# Patient Record
Sex: Male | Born: 1955
Health system: Southern US, Community
[De-identification: ages and names within clinical notes are randomized; demographics above are authoritative.]

## PROBLEM LIST (undated history)

## (undated) DIAGNOSIS — E669 Obesity, unspecified: Secondary | ICD-10-CM

## (undated) DIAGNOSIS — I219 Acute myocardial infarction, unspecified: Secondary | ICD-10-CM

## (undated) DIAGNOSIS — G709 Myoneural disorder, unspecified: Secondary | ICD-10-CM

## (undated) DIAGNOSIS — E785 Hyperlipidemia, unspecified: Secondary | ICD-10-CM

## (undated) DIAGNOSIS — T7840XA Allergy, unspecified, initial encounter: Secondary | ICD-10-CM

## (undated) DIAGNOSIS — Z5189 Encounter for other specified aftercare: Secondary | ICD-10-CM

## (undated) DIAGNOSIS — I2510 Atherosclerotic heart disease of native coronary artery without angina pectoris: Secondary | ICD-10-CM

## (undated) DIAGNOSIS — I10 Essential (primary) hypertension: Secondary | ICD-10-CM

## (undated) DIAGNOSIS — E119 Type 2 diabetes mellitus without complications: Secondary | ICD-10-CM

## (undated) DIAGNOSIS — G4730 Sleep apnea, unspecified: Secondary | ICD-10-CM

## (undated) DIAGNOSIS — K219 Gastro-esophageal reflux disease without esophagitis: Secondary | ICD-10-CM

## (undated) DIAGNOSIS — K759 Inflammatory liver disease, unspecified: Secondary | ICD-10-CM

## (undated) DIAGNOSIS — M1990 Unspecified osteoarthritis, unspecified site: Secondary | ICD-10-CM

## (undated) HISTORY — DX: Sleep apnea, unspecified: G47.30

## (undated) HISTORY — DX: Atherosclerotic heart disease of native coronary artery without angina pectoris: I25.10

## (undated) HISTORY — DX: Acute myocardial infarction, unspecified: I21.9

## (undated) HISTORY — PX: CORONARY ANGIOPLASTY WITH STENT PLACEMENT: SHX49

## (undated) HISTORY — DX: Allergy, unspecified, initial encounter: T78.40XA

## (undated) HISTORY — DX: Obesity, unspecified: E66.9

## (undated) HISTORY — DX: Unspecified osteoarthritis, unspecified site: M19.90

## (undated) HISTORY — PX: OTHER SURGICAL HISTORY: SHX169

## (undated) HISTORY — PX: VASECTOMY: SHX75

## (undated) HISTORY — DX: Hyperlipidemia, unspecified: E78.5

## (undated) HISTORY — PX: SIGMOIDOSCOPY: SUR1295

## (undated) HISTORY — DX: Essential (primary) hypertension: I10

---

## 1982-11-11 HISTORY — PX: ELBOW SURGERY: SHX618

## 2003-01-24 ENCOUNTER — Encounter: Admission: RE | Admit: 2003-01-24 | Discharge: 2003-04-24 | Payer: Self-pay | Admitting: Internal Medicine

## 2006-11-11 DIAGNOSIS — I219 Acute myocardial infarction, unspecified: Secondary | ICD-10-CM

## 2006-11-11 HISTORY — DX: Acute myocardial infarction, unspecified: I21.9

## 2007-02-13 ENCOUNTER — Inpatient Hospital Stay (HOSPITAL_COMMUNITY): Admission: EM | Admit: 2007-02-13 | Discharge: 2007-02-16 | Payer: Self-pay | Admitting: Emergency Medicine

## 2007-02-13 ENCOUNTER — Emergency Department (HOSPITAL_COMMUNITY): Admission: EM | Admit: 2007-02-13 | Discharge: 2007-02-13 | Payer: Self-pay | Admitting: Emergency Medicine

## 2007-02-26 ENCOUNTER — Encounter (HOSPITAL_COMMUNITY): Admission: RE | Admit: 2007-02-26 | Discharge: 2007-05-27 | Payer: Self-pay | Admitting: Cardiology

## 2007-04-30 ENCOUNTER — Ambulatory Visit: Payer: Self-pay | Admitting: Internal Medicine

## 2007-05-04 ENCOUNTER — Ambulatory Visit (HOSPITAL_BASED_OUTPATIENT_CLINIC_OR_DEPARTMENT_OTHER): Admission: RE | Admit: 2007-05-04 | Discharge: 2007-05-04 | Payer: Self-pay | Admitting: Internal Medicine

## 2007-05-10 ENCOUNTER — Ambulatory Visit: Payer: Self-pay | Admitting: Internal Medicine

## 2007-05-20 ENCOUNTER — Ambulatory Visit: Payer: Self-pay | Admitting: Internal Medicine

## 2007-05-28 ENCOUNTER — Encounter (HOSPITAL_COMMUNITY): Admission: RE | Admit: 2007-05-28 | Discharge: 2007-08-11 | Payer: Self-pay | Admitting: Cardiology

## 2008-11-11 HISTORY — PX: JOINT REPLACEMENT: SHX530

## 2008-12-29 ENCOUNTER — Encounter: Payer: Self-pay | Admitting: Cardiology

## 2009-09-04 ENCOUNTER — Inpatient Hospital Stay (HOSPITAL_COMMUNITY): Admission: RE | Admit: 2009-09-04 | Discharge: 2009-09-06 | Payer: Self-pay | Admitting: Orthopedic Surgery

## 2010-05-03 ENCOUNTER — Encounter: Payer: Self-pay | Admitting: Internal Medicine

## 2010-07-24 ENCOUNTER — Ambulatory Visit: Payer: Self-pay | Admitting: Cardiology

## 2010-07-27 ENCOUNTER — Ambulatory Visit: Payer: Self-pay | Admitting: Cardiology

## 2010-12-14 NOTE — Letter (Signed)
Summary: CMN for CPAP Supplies/[facility]  CMN for CPAP Supplies/[facility]   Imported By: Sherian Rein 05/16/2010 13:38:50  _____________________________________________________________________  External Attachment:    Type:   Image     Comment:   External Document

## 2011-01-11 ENCOUNTER — Telehealth (INDEPENDENT_AMBULATORY_CARE_PROVIDER_SITE_OTHER): Payer: Self-pay | Admitting: *Deleted

## 2011-01-22 NOTE — Progress Notes (Signed)
Summary: req to have father seen by dr. Maple Hudson   Phone Note Call from Patient   Caller: Patient Call For: young Summary of Call: although pt last saw dr young in 08 (says he's doing fine) he requests that dr young please see pt's dad asap for pna f/u. pt lives at [facility] and needs to be seen asap. also son feels pt has sleep apnea. pt thinks very highly of dr young and would "greatly appreciate him seeing his dad. father is Peter Gray DOB: [birth year] mrn: [id number]. call son at cell [phone] Initial call taken by: Tivis Ringer, CNA,  January 11, 2011 3:13 PM  Follow-up for Phone Call        Please call patient and let him know at this time we are going through a system change and have no way to work his dad in; he can make an appt for first open slot with CDY and see his PCP in the meantime for PNA f/u. Also, the pt's son can call and check for cancelled appts that he might get in sooner.Peter Gray CMA  January 15, 2011 5:00 PM   [facility] Carver Fila  January 15, 2011 5:13 PM   Additional Follow-up for Phone Call Additional follow up Details #1::        Spoke with pt and advised of the above.  He verbalized understanding.  States that his Father does not necc have to be seen by Dr Maple Hudson.  He would just like for him to get est here with a pulm doc.  I advised then in that case I will look for the first available consult slot with any MD and put him there.  He states that he will have to call back to sched this as he does not have his sched in front of him.  I advised that this is fine, he can set this up with any of our schedulers.  Additional Follow-up by: Vernie Murders,  January 16, 2011 10:35 AM

## 2011-02-14 LAB — BASIC METABOLIC PANEL
Calcium: 8.2 mg/dL — ABNORMAL LOW (ref 8.4–10.5)
Chloride: 103 mEq/L (ref 96–112)
Chloride: 104 mEq/L (ref 96–112)
GFR calc Af Amer: >60 mL/min (ref >60)
Glucose, Bld: 176 mg/dL — ABNORMAL HIGH (ref 70–99)
Potassium: 5.5 mEq/L — ABNORMAL HIGH (ref 3.5–5.1)

## 2011-02-14 LAB — CBC
HCT: 42.3 % (ref 39.0–52.0)
MCHC: 34.8 g/dL (ref 30.0–36.0)
MCHC: 35.2 g/dL (ref 30.0–36.0)
MCV: 92.4 fL (ref 78.0–100.0)
MCV: 93.4 fL (ref 78.0–100.0)
Platelets: 157 10*3/uL (ref 150–400)
RBC: 3.6 MIL/uL — ABNORMAL LOW (ref 4.22–5.81)
RBC: 4.57 MIL/uL (ref 4.22–5.81)

## 2011-02-14 LAB — DIFFERENTIAL
Basophils Absolute: 0 10*3/uL (ref 0.0–0.1)
Eosinophils Relative: 1 % (ref 0–5)
Lymphocytes Relative: 29 % (ref 12–46)
Monocytes Relative: 6 % (ref 3–12)
Neutrophils Relative %: 64 % (ref 43–77)

## 2011-02-14 LAB — URINALYSIS, ROUTINE W REFLEX MICROSCOPIC
Bilirubin Urine: NEGATIVE
Hgb urine dipstick: NEGATIVE
Ketones, ur: NEGATIVE mg/dL
Nitrite: NEGATIVE
Protein, ur: NEGATIVE mg/dL
Specific Gravity, Urine: 1.029 (ref 1.005–1.030)
Urobilinogen, UA: 0.2 mg/dL (ref 0.0–1.0)
pH: 6 (ref 5.0–8.0)

## 2011-02-14 LAB — PROTIME-INR
INR: 1.03 (ref 0.00–1.49)
INR: 1.11 (ref 0.00–1.49)

## 2011-02-14 LAB — TYPE AND SCREEN
ABO/RH(D): O POS
Antibody Screen: NEGATIVE

## 2011-03-26 NOTE — Procedures (Signed)
NAME:  Peter Gray, Peter Gray                 ACCOUNT NO.:  [id number]   MEDICAL RECORD NO.:  [id number]          PATIENT TYPE:  OUT   LOCATION:  SLEEP CENTER                 FACILITY:  [facility]   PHYSICIAN:  Clinton D. Maple Hudson, MD, FCCP, FACPDATE OF BIRTH:  10-30-1957   DATE OF STUDY:  05/04/2007                            NOCTURNAL POLYSOMNOGRAM   REFERRING PHYSICIAN:  Clinton D. Young, MD, FCCP, FACP   INDICATIONS FOR PROCEDURE:  Hypersomnia with sleep apnea.   RESULTS:  Epward sleepiness score 16/24, BMI 29, weight 235 pounds.   MEDICATIONS:  Home medications are listed and reviewed.   SLEEP ARCHITECTURE:  Total sleep time 379 minutes with sleep efficiency  93%. Stage 1 was 4%; Stage 2, 80%; Stages 3 and 4, absent. REM 17% of  total sleep time. Sleep latency 5 minuets, REM latency 75 minutes. Awake  after sleep onset, 25 minutes. Arousal index, 8.1. No bedtime medication  was taken.   RESPIRATORY DATA:  Split study protocol. Apnea/hypopnea index (AHI RDI)  14.6 obstructive events per hour, indicating mild to moderate  obstructive sleep apnea/hypopnea syndrome before CPAP. There were 8  obstructive apnea's, 1 central apnea, and 31 hypopnea's before CPAP.  Events were not positional. REM AHI 3.7. CPAP was titrated to 10 CWP,  AHI zero per hour. An Opti-Life II with large nasal pillows mask was  chosen with a heated humidifier.   OXYGEN DATA:  Mild to moderate snoring with oxygen desaturation to a  nadir of 83% before CPAP. After CPAP control, saturation held 94% on  room air.   CARDIAC DATA:  Sinus rhythm with occasional PVC.   MOVEMENT/PARASOMNIA:  Occasional limb jerk with arousal, not  significant. Bathroom x1.   IMPRESSION/RECOMMENDATIONS:  1. Mild to moderate obstructive sleep apnea/hypopnea syndrome, AHI      14.6 per hour. Non-positional events with mild to moderate snoring      and oxygen desaturation to a nadir of 81%.  2. Successful CPAP titration to 10 CWP, AHI zero per hour.  An Opti-      Life II was chosen with large nasal pillows and heated humidifier.      Clinton D. Maple Hudson, MD, [facility], FACP  Diplomate, Biomedical engineer of Sleep Medicine  Electronically Signed     CDY/MEDQ  D:  05/10/2007 11:13:22  T:  05/10/2007 20:19:40  Job:  [id number]

## 2011-03-26 NOTE — Assessment & Plan Note (Signed)
[facility]                             PULMONARY OFFICE NOTE   NAME:Myre, UNO ESAU                        MRN:          [id number]  DATE:04/30/2007                            DOB:          Dec 21, 1955    PROBLEM:  A 55 year old man diagnosed in the past with obstructive sleep  apnea wanting reevaluation.  He rarely uses CPAP unless he shares room  with his wife.  Machine was too loud and by description that was many  years ago.  I do not have those record currently.  He is aware that he  snores loudly and that CPAP did help.  There is some daytime sleepiness.  He has had a myocardial infarction and is trying to pull things  together, I think.   MEDICATIONS:  Altace, metoprolol, Plavix, Lipitor, fish oil, aspirin 325  mg, Naproxen, nitroglycerin.  No medication allergy.   REVIEW OF SYSTEMS:  Chest pain at time of myocardial infarction, February 13, 2007.  He dieted off 40 pounds to improve his lipids, regained 25  pounds and has dieted off 25 again.  Loud snoring.  No significant nasal  congestion.  Some joint stiffness.   PAST MEDICAL HISTORY:  1. Obstructive sleep apnea.  2. Resection of a nasal polyp.  3. Myocardial infarction in April 2008 with stent.  4. Elevated cholesterol.  5. No history of lung disease, thyroid disease, or ENT surgery.  6. He has had some arthritis and surgery for a bone spur on his elbow.   SOCIAL HISTORY:  Rare social alcohol.  He is married with children,  works as an Pensions consultant.   FAMILY HISTORY:  Maternal grandmother smoked, had emphysema.  Mother  died in her 27s of heart disease and had breast cancer.  Father snores.  Nobody known to have sleep apnea.   OBJECTIVE:  VITAL SIGNS:  Weight 239 pounds, BP 110/64, pulse 69, room  air saturation 99%.  GENERAL:  He is an alert, calm gentleman.  NEUROLOGIC:  Unremarkable to observation.  HEENT:  Palate spacing 3/4.  Voice quality is normal without stridor.  Nasal airway is  unobstructed.  CHEST:  Quiet, clear breath sounds, unlabored.  HEART:  Sounds regular without murmur or gallop.  No restlessness or  tremor.   IMPRESSION:  1. Obstructive sleep apnea needing to reestablish diagnosis and assess      best therapy.  We will plan a split protocol sleep study and bring      him back after that is completed for followup.  2. His additional problems are coronary disease with myocardial      infarction and hyperlipidemia.     Clinton D. Maple Hudson, MD, [facility], FACP  Electronically Signed    CDY/MedQ  DD: 04/30/2007  DT: 05/01/2007  Job #: [id number]   cc:   Geoffry Paradise, M.D.  Peter M. Swaziland, M.D.  [facility]

## 2011-03-26 NOTE — Assessment & Plan Note (Signed)
[facility]                             PULMONARY OFFICE NOTE   NAME:Peter Gray, Peter Gray                        MRN:          [id number]  DATE:05/20/2007                            DOB:          12-06-1955    PULMONARY FOLLOWUP   PROBLEM LIST:  1. Obstructive sleep apnea.  2. Coronary disease/myocardial infarction.  3. Hyperlipidemia.   HISTORY:  He returns after a sleep study done May 04, 2007  demonstrating a mild to moderate obstructive apnea with an index of 14.6  per hour, mild to moderate snoring, and desaturation to 81%.  CPAP  titrated to 10 CWP for an index of 0 per hour.  We reviewed his previous  history and, again, reviewed the physiology and medical options for  sleep apnea.   OBJECTIVE:  Weight 237 pounds, BP 118/68, pulse 72, room air saturation  97%.  He is calm and alert.  Nasal airway is not obstructed.  Breathing is unlabored.  Pulse regular.  Voice quality normal without stridor.   IMPRESSION:  Obstructive sleep apnea (AHI 14.6 per hour).  Note that his  arthritis problems effect his sleep comfort as well.   PLAN:  1. Home CPAP trial at 10 CWP.  2. Short-term availability temazepam 15 mg #30 one or 2 at bedtime      p.r.n. sleep with discussion.  3. Sleep hygiene.  4. Schedule return in 1 month, earlier p.r.n.     Clinton D. Maple Hudson, MD, [facility]oll[facility], FACP  Electronically Signed    CDY/MedQ  DD: 06/04/2007  DT: 06/05/2007  Job #: [id number]   cc:   Geoffry Paradise, M.D.  Peter M. Swaziland, M.D.

## 2011-03-29 NOTE — Cardiovascular Report (Signed)
Peter Gray, Peter Gray NO.:  [id number]   MEDICAL RECORD NO.:  [id number]          PATIENT TYPE:  INP   LOCATION:  2917                         FACILITY:  [facility]   PHYSICIAN:  Peter M. Swaziland, M.D.  DATE OF BIRTH:  [birth year]   DATE OF PROCEDURE:  02/13/2007  DATE OF DISCHARGE:                            CARDIAC CATHETERIZATION   INDICATIONS FOR PROCEDURE:  The patient is a 55 year old white male with  history of hyperlipidemia who presents with refractory chest pain.  ECG  is nondiagnostic, but cardiac enzymes were mildly elevated.  The patient  has refractory chest pain despite optimal medical therapy.   PROCEDURES:  1. Left heart catheterization.  2. Coronary and left ventricular angiography.  3. Intracoronary stenting of the left circumflex coronary artery.   ACCESS:  Via right femoral artery using standard Seldinger technique.   EQUIPMENT:  6-French 4 cm right and left Judkins catheters, 6-French  pigtail catheter, 6-French arterial sheath, 6-French FL-4 guide,  0.014  high-torque floppy wire, a 3.0 x 15 mm Maverick balloon, a 3.5 x 16 mm  Liberte stent, and a 3.5 x 12-mm Quantum Maverick balloon.   MEDICATIONS:  1. Local anesthesia with 1% Xylocaine.  2. Versed 4 mg IV.  3. Fentanyl 25 mcg IV.  4. Heparin 2000 units IV with subsequent ACT of 242.  5. Integrilin double bolus followed by continuous IV infusion.  6. Plavix 600 mg p.o.   CONTRAST:  150 mL of Omnipaque.   HEMODYNAMIC DATA:  Aortic pressure was 148/91 with a mean of 114. Left  ventricle pressure was 149 with EDP of 30 mmHg.   ANGIOGRAPHIC DATA:  The left coronary artery arises and distributes  normally.  The left main coronary artery appears normal.   The left anterior descending artery has scattered mild narrowings in the  proximal mid vessel up to 20%.   The left circumflex coronary is a large vessel which is occluded  proximally.  There are right-to-left collaterals to the  circumflex from  the right coronary.   The right coronary is a large, dominant vessel.  It has diffuse disease  with mild narrowings in the proximal, mid, and distal vessel up to 20%.   The left ventricular angiography was performed in the procedure.  This  was performed in both the RAO and LAO cranial views.  This demonstrates  mild left ventricular enlargement.  There is hypokinesia of the inferior  and lateral wall with overall mild left ventricular dysfunction.  Ejection fraction is estimated at 45%.  There is no mitral regurgitation  or prolapse.   We proceeded with acute intervention of the left circumflex coronary  artery.  We were able to cross the lesion without difficulty with the  wire.  This demonstrated a large terminal circumflex with two large  marginal vessels.  The lesion appeared to terminate before the takeoff  of the first marginal vessel.  This lesion was predilated using a 3.0 x  15 mm Maverick balloon up to 6 atmospheres.  We then deployed the 3.5 x  16 mm Liberte stent across the  lesion, dilating to 9 atmospheres and  then 12 atmospheres with the stent balloon.  We then postdilated the  entire segment of the stent with a 3.5 x 12 mm Quantum Maverick balloon,  dilating up to 14 atmospheres x2.  This yielded an excellent  angiographic result with 0% residual stenosis and TIMI grade 3 flow.  The patient remained hemodynamically stable throughout.   FINAL INTERPRETATION:  1. Single-vessel occlusive atherosclerotic coronary artery disease.  2. Mild left ventricular dysfunction.  3. Successful intracoronary stenting of the left circumflex coronary.           ______________________________  Peter M. Swaziland, M.D.     PMJ/MEDQ  D:  02/13/2007  T:  02/14/2007  Job:  [id number]

## 2011-03-29 NOTE — Discharge Summary (Signed)
Peter Gray, Peter Gray NO.:  [id number]   MEDICAL RECORD NO.:  [id number]          PATIENT TYPE:  INP   LOCATION:  3732                         FACILITY:  [facility]   PHYSICIAN:  Peter M. Swaziland, M.D.  DATE OF BIRTH:  04-19-1956   DATE OF ADMISSION:  02/13/2007  DATE OF DISCHARGE:  02/16/2007                               DISCHARGE SUMMARY   HISTORY OF PRESENT ILLNESS:  The patient is a 55 year old white male who  presented with refractory chest pain.  He had developed chest pain the  night prior to admission.  He was seen in Urgent Care and an ECG, at  that time, was normal.  He subsequently presented back to the emergency  department with recurrent chest pain.  Once, again, ECG and cardiac  enzymes were negative.  His pain seemed to abate; and he was discharged  to home.  He subsequently had recurrent severe chest pain in the mid  morning and presented to Dr. Lanell Matar office and was referred back to  the emergency department.  Again, ECG was normal; but at this time his  troponin was mildly elevated.  The patient had had ongoing chest pain at  time of presentation.  He had a CT angiogram of the chest which was  negative.  Abdominal ultrasound was also negative.  The patient does  have a history of hyperlipidemia and obesity.  For details of his past  medical history, social history, family history, and physical exam  please see admission history and physical.   LABORATORY DATA:  White count was 12,700; hemoglobin 13.7; hematocrit  39.7; platelets 221,000.  Sodium 135, potassium 3.9, chloride 101, CO2  26, glucose 192, BUN 12, creatinine 1.10, calcium was 8.7.  Hemoglobin  A1c was 6.4%.  Initial CK was 77 with 5.2 MB, troponin was 0.09.  Cholesterol was 239, triglycerides 554, HDL 23, LDL could not be  calculated due to his high triglycerides.  ECG showed normal sinus  rhythm there was very slight ST depression in lead aVL and V-2.  Chest x-  ray was   unremarkable.   HOSPITAL COURSE:  The patient was treated in the emergency department  with aspirin, IV heparin, and nitroglycerin.  We proceeded with urgent  cardiac catheterization.  This demonstrated nonobstructive disease in  the LAD and right coronary distributions.  The left circumflex coronary  artery was a large vessel, and occluded proximally.  There was some  collateral flow from the right coronary.  Left ventricular angiography  showed inferior and inferolateral hypokinesia with overall mild left  ventricular dysfunction, and ejection fraction approximately 45%.   The patient underwent emergent stenting of the left circumflex coronary  artery.  He had a 3.5 x 16 mm Liberte stent  placed.  This revealed an  excellent angiographic result with TIMI grade 3 flow down two large  marginal vessels.  His chest pain abated at this time.  He was  subsequently treated with aspirin and Plavix.  He was treated with  Integrilin for 18 hours; and he was started on beta blocker and statin  therapy.  Once we received his lipid results he was also started on  Lovaza as his blood pressure stabilized; he was started on an ACE  inhibitor as well.   The patient was progressively ambulated.  He had no further chest pain  or shortness of breath.  He had no arrhythmias.  His groin showed some  moderate bruising, but no significant hematoma and pulses remained 2+.  His cardiac enzymes:  CPK increased to 970 with 227.8 MB and then 1191  with 264.4 MB.  Troponins increased to 17.61 and then 33.81.  His ECG  remained normal.  The patient was progressively ambulated with cardiac  rehab.  It was felt that he was stable for discharge on February 16, 2007.   DISCHARGE DIAGNOSIS:  1. Acute lateral myocardial infarction.  2. Status post successful stenting of the proximal left circumflex      coronary.  3. Combined hyperlipidemia.  4. Obesity.   DISCHARGE MEDICATIONS:  1. Coated aspirin 325 mg per day.  2.  Plavix 75 mg per day.  3. Lipitor 80 mg per day.  4. Toprol XL 100 mg per day.  5. Altace 2.5 mg daily.  6. Lovaza 4 grams daily.  7. Nitroglycerin 0.4 mg sublingual p.r.n.   Patient is to gradually increase his activity.  I have recommended  avoiding lifting for 1 week.  He will see Dr. Swaziland back in  approximately 1 week for followup; and I have encouraged him to enroll  in cardiac rehab.           ______________________________  Peter M. Swaziland, M.D.     PMJ/MEDQ  D:  02/16/2007  T:  02/16/2007  Job:  [id number]   cc:   Geoffry Paradise, M.D.

## 2011-03-29 NOTE — H&P (Signed)
Peter Gray, LANDESS NO.:  [id number]   MEDICAL RECORD NO.:  [id number]          PATIENT TYPE:  INP   LOCATION:  2917                         FACILITY:  [facility]   PHYSICIAN:  Peter M. Swaziland, M.D.  DATE OF BIRTH:  Apr 16, 1956   DATE OF ADMISSION:  02/13/2007  DATE OF DISCHARGE:                              HISTORY & PHYSICAL   HISTORY OF PRESENT ILLNESS:  The patient is a 55 year old white male who  presents with refractory chest pain.  He describes his pain as a severe  burning sensation in his mid sternum radiating out to the parasternal  regions.  He has no associated shortness of breath, nausea, vomiting or  diaphoresis.  Pain began last evening after having eaten a heavy Chinese  meal.  It became more severe around 8:30.  He went to Urgent Care. There  chest x-ray and ECG were unremarkable.  The patient was sent home.  He  awoke later in the night with more severe pain and came to the emergency  room. At that time, ECG again was normal, and point-of-care cardiac  enzymes were negative x2.  He was given a GI cocktail that he does not  really think offered much relief, but his pain did seem to ebb.  He was  discharged about 6:30 a.m. Later in the morning, his pain then recurred,  again more severe. He went to Dr. Lanell Matar office who recommended he  come to the emergency department.   At this time, he had a CT angiogram of the chest which was negative.  Abdominal ultrasound was normal.  He had another set of cardiac enzymes  that showed an elevated troponin of 0.09, and CPK was 77 with 5.2 MB. He  has been treated with IV heparin, nitroglycerin, aspirin and morphine  but is still having refractory chest pain.  Because of these findings,  he is admitted for further evaluation.   PAST MEDICAL HISTORY:  Is significant for:  1. Mild hypercholesterolemia.  2. He has a history of obesity associated with glucose intolerance.  3. He has a history of severe  arthritis.   ALLERGIES:  BEE STINGS.   PRIOR MEDICATIONS:  Include p.r.n. Indocin CR and Naprosyn.   PRIOR SURGERY:  Includes removal of bone spur in his right elbow.   SOCIAL HISTORY:  He is a Clinical research associate.  He has two daughters.  He is married.  He denies tobacco use.   FAMILY HISTORY:  Mother died of coronary disease at age 15. She was  chronically ill with ankylosing spondylitis, arthritis, and breast  cancer.  His father is still alive at age 21, and he has one sibling  alive and well.   REVIEW OF SYSTEMS:  His review of systems is otherwise unremarkable.   PHYSICAL EXAMINATION:  GENERAL:  He is a well-developed white male in no  distress.  VITAL SIGNS:  Blood pressure 123/73. Pulse is 72 and regular.  He is  afebrile.  Saturation is 99% on room air.  HEENT:  Exam is unremarkable.  NECK:  No JVD or bruits.  LUNGS:  Clear.  CARDIAC:  Exam reveals regular rate and rhythm without murmur, rub or  gallop.  He has no chest wall tenderness to palpation.  ABDOMEN:  Soft, nontender, without masses or bruits.  EXTREMITIES:  Pulses were 2+ and symmetric.  He has no edema.  NEUROLOGIC:  Exam is nonfocal.   LABORATORY DATA:  The Ph was 7.42, pCO2 of 53, bicarb 34. Hemoglobin  15.6, hematocrit 46. Sodium 139, potassium 3.9, chloride 104, BUN 21,  creatinine 1.1, glucose of 118. D-dimer is less than 0.22.   Cardiac enzymes, CT of the chest, and abdominal ultrasound are already  mentioned.   ECG shows normal sinus rhythm with slight ST depression in leads aVL and  V2.   IMPRESSION:  1. Acute coronary syndrome, probable non-Q-wave myocardial infarction      with ongoing chest pain despite aggressive medical therapy.  2. Hypercholesterolemia.  3. History of obesity and glucose intolerance.  4. Arthritis.   PLAN:  Recommend emergent cardiac catheterization to further define the  etiology of his chest pain offer therapeutic options.  He will continue  on aspirin, IV nitroglycerin and  heparin.  Continue analgesia with  morphine.           ______________________________  Peter M. Swaziland, M.D.     PMJ/MEDQ  D:  02/13/2007  T:  02/14/2007  Job:  [id number]   cc:   Geoffry Paradise, M.D.  Areatha Keas, M.D.

## 2011-04-02 ENCOUNTER — Other Ambulatory Visit: Payer: Self-pay | Admitting: Cardiology

## 2011-04-02 NOTE — Telephone Encounter (Signed)
escribe medication per fax request  

## 2011-04-09 ENCOUNTER — Encounter: Payer: Self-pay | Admitting: Cardiology

## 2011-06-07 ENCOUNTER — Other Ambulatory Visit: Payer: Self-pay | Admitting: Cardiology

## 2011-06-07 NOTE — Telephone Encounter (Signed)
escribe medication per fax request  

## 2011-07-01 ENCOUNTER — Other Ambulatory Visit: Payer: Self-pay | Admitting: Cardiology

## 2011-07-01 NOTE — Telephone Encounter (Signed)
escribe medication per fax request  

## 2011-08-14 ENCOUNTER — Other Ambulatory Visit: Payer: Self-pay | Admitting: *Deleted

## 2011-08-14 NOTE — Telephone Encounter (Signed)
Opened in Error.

## 2011-09-09 ENCOUNTER — Telehealth: Payer: Self-pay | Admitting: Cardiology

## 2011-09-09 NOTE — Telephone Encounter (Signed)
Pt needs refill of nitro, refills have expired, [facility] [employer]

## 2011-09-10 ENCOUNTER — Other Ambulatory Visit: Payer: Self-pay

## 2011-09-11 MED ORDER — NITROGLYCERIN 0.4 MG SL SUBL: 0.4000 mg | SUBLINGUAL_TABLET | SUBLINGUAL | Status: DC | PRN

## 2011-09-24 ENCOUNTER — Other Ambulatory Visit: Payer: Self-pay | Admitting: Cardiology

## 2011-12-02 ENCOUNTER — Other Ambulatory Visit: Payer: Self-pay | Admitting: Cardiology

## 2011-12-05 ENCOUNTER — Telehealth: Payer: Self-pay | Admitting: Cardiology

## 2011-12-05 NOTE — Telephone Encounter (Signed)
New Problem:    Patient called in needing a refill of all of his medications on his list, especially his LOVAZA 1 G capsule.

## 2011-12-06 ENCOUNTER — Other Ambulatory Visit: Payer: Self-pay

## 2011-12-06 MED ORDER — OMEGA-3-ACID ETHYL ESTERS 1 G PO CAPS: 1.0000 g | ORAL_CAPSULE | Freq: Four times a day (QID) | ORAL | Status: DC

## 2011-12-06 NOTE — Telephone Encounter (Signed)
Pt. Has appt in February so sent refill in.

## 2011-12-22 ENCOUNTER — Other Ambulatory Visit: Payer: Self-pay | Admitting: Cardiology

## 2011-12-24 ENCOUNTER — Ambulatory Visit (INDEPENDENT_AMBULATORY_CARE_PROVIDER_SITE_OTHER): Payer: BC Managed Care – PPO | Admitting: Cardiology

## 2011-12-24 ENCOUNTER — Encounter: Payer: Self-pay | Admitting: Cardiology

## 2011-12-24 VITALS — BP 118/70 | HR 67 | Ht 75.0 in | Wt 259.0 lb

## 2011-12-24 DIAGNOSIS — I10 Essential (primary) hypertension: Secondary | ICD-10-CM | POA: Insufficient documentation

## 2011-12-24 DIAGNOSIS — E785 Hyperlipidemia, unspecified: Secondary | ICD-10-CM

## 2011-12-24 DIAGNOSIS — E669 Obesity, unspecified: Secondary | ICD-10-CM

## 2011-12-24 DIAGNOSIS — I2510 Atherosclerotic heart disease of native coronary artery without angina pectoris: Secondary | ICD-10-CM

## 2011-12-24 MED ORDER — RAMIPRIL 2.5 MG PO CAPS: 2.5000 mg | ORAL_CAPSULE | Freq: Every day | ORAL | Status: DC

## 2011-12-24 MED ORDER — OMEGA-3-ACID ETHYL ESTERS 1 G PO CAPS: 1.0000 g | ORAL_CAPSULE | Freq: Four times a day (QID) | ORAL | Status: DC

## 2011-12-24 MED ORDER — METOPROLOL SUCCINATE ER 100 MG PO TB24: 100.0000 mg | ORAL_TABLET | Freq: Every day | ORAL | Status: DC

## 2011-12-24 MED ORDER — ATORVASTATIN CALCIUM 80 MG PO TABS: 80.0000 mg | ORAL_TABLET | Freq: Every day | ORAL | Status: DC

## 2011-12-24 NOTE — Patient Instructions (Signed)
We will schedule you for fasting lab work.  I will see you again in 1 year.

## 2011-12-24 NOTE — Assessment & Plan Note (Signed)
Blood pressure is well controlled on his current medications. We will continue his current regimen.

## 2011-12-24 NOTE — Progress Notes (Signed)
   Peter Gray Date of Birth: 1956/10/12 Medical Record #[id number]  History of Present Illness: Peter Gray is seen for followup today. He was last seen in September of 2011. He has a history of coronary disease and had a lateral myocardial infarction in April 2008. He had emergent stenting of the left circumflex coronary at that time with a bare-metal stent. He had a normal nuclear stress test in February of 2010. He admits that he has not been doing well with his diet. He eats out frequently. He has gained weight. He hasn't been exercising regularly. He denies any complaints. He specifically denies any chest pain, shortness of breath, or palpitations.  Current Outpatient Prescriptions on File Prior to Visit  Medication Sig Dispense Refill  . nitroGLYCERIN (NITROSTAT) 0.4 MG SL tablet Place 1 tablet (0.4 mg total) under the tongue every 5 (five) minutes as needed for chest pain.  25 tablet  3  . DISCONTD: atorvastatin (LIPITOR) 80 MG tablet TAKE ONE TABLET BY MOUTH DAILY  30 tablet  0  . DISCONTD: omega-3 acid ethyl esters (LOVAZA) 1 G capsule Take 1 capsule (1 g total) by mouth 4 (four) times daily.  120 capsule  0    No Known Allergies  Past Medical History  Diagnosis Date  . Hyperlipidemia   . Obesity   . Hypertension   . Coronary artery disease   . Sleep apnea   . Gout   . Myocardial infarction   . Arthritis     Past Surgical History  Procedure Date  . Coronary angioplasty with stent placement   . Elbow surgery     History  Smoking status  . Never Smoker   Smokeless tobacco  . Not on file    History  Alcohol Use     Family History  Problem Relation Age of Onset  . Heart disease Mother     CAD  . Cancer Mother     Breast Cancer    Review of Systems: The review of systems is positive for chronic arthralgias in his feet. He does take Indocin rarely for symptoms of gout.  All other systems were reviewed and are negative.  Physical Exam: BP 118/70  Pulse 67  Ht  6\' 3"  (1.905 m)  Wt 259 lb (117.482 kg)  BMI 32.37 kg/m2 He is an overweight white male in no acute distress.The patient is alert and oriented x 3.  The mood and affect are normal.  The skin is warm and dry.  Color is normal.  The HEENT exam reveals that the sclera are nonicteric.  The mucous membranes are moist.  The carotids are 2+ without bruits.  There is no thyromegaly.  There is no JVD.  The lungs are clear.  The chest wall is non tender.  The heart exam reveals a regular rate with a normal S1 and S2.  There are no murmurs, gallops, or rubs.  The PMI is not displaced.   Abdominal exam reveals good bowel sounds.  There is no guarding or rebound.  There is no hepatosplenomegaly or tenderness.  There are no masses.  Exam of the legs reveal no clubbing, cyanosis, or edema.  The legs are without rashes.  The distal pulses are intact.  Cranial nerves II - XII are intact.  Motor and sensory functions are intact.  The gait is normal.  LABORATORY DATA: ECG demonstrates normal sinus rhythm with a normal ECG.  Assessment / Plan:

## 2011-12-24 NOTE — Assessment & Plan Note (Signed)
He remains asymptomatic at this time. His ECG is normal. We will continue to focus on risk factor modification. He clearly needs to do better with his lifestyle modification including increased aerobic activity and eating a heart healthy diet. He needs to lose weight. We did refill his medications today we'll plan a followup again in one year. I would anticipate a repeat stress test next year.

## 2011-12-24 NOTE — Assessment & Plan Note (Signed)
He has not had followup evaluation since September of 2011. We will schedule him for fasting lab work. This will include complete chemistries, lipid panel, and A1c.

## 2011-12-25 ENCOUNTER — Ambulatory Visit (INDEPENDENT_AMBULATORY_CARE_PROVIDER_SITE_OTHER): Payer: BC Managed Care – PPO | Admitting: *Deleted

## 2011-12-25 DIAGNOSIS — I2510 Atherosclerotic heart disease of native coronary artery without angina pectoris: Secondary | ICD-10-CM

## 2011-12-25 DIAGNOSIS — I10 Essential (primary) hypertension: Secondary | ICD-10-CM

## 2011-12-25 DIAGNOSIS — E785 Hyperlipidemia, unspecified: Secondary | ICD-10-CM

## 2011-12-25 LAB — BASIC METABOLIC PANEL
BUN: 21 mg/dL (ref 6–23)
Chloride: 100 mEq/L (ref 96–112)
GFR: 81.59 mL/min (ref >60.00)
Glucose, Bld: 134 mg/dL — ABNORMAL HIGH (ref 70–99)
Potassium: 5.3 mEq/L — ABNORMAL HIGH (ref 3.5–5.1)

## 2011-12-25 LAB — LIPID PANEL
LDL Cholesterol: 102 mg/dL — ABNORMAL HIGH (ref 0–99)
Total CHOL/HDL Ratio: 4
VLDL: 27.6 mg/dL (ref 0.0–40.0)

## 2011-12-25 LAB — HEPATIC FUNCTION PANEL
Bilirubin, Direct: 0.1 mg/dL (ref 0.0–0.3)
Total Bilirubin: 1.1 mg/dL (ref 0.3–1.2)

## 2011-12-25 LAB — PSA: PSA: 0.6 ng/mL (ref 0.10–4.00)

## 2011-12-31 NOTE — Progress Notes (Signed)
Addended by: Judithe Modest D on: 12/31/2011 05:54 PM   Modules accepted: Orders

## 2012-01-01 ENCOUNTER — Telehealth: Payer: Self-pay | Admitting: Cardiology

## 2012-01-01 NOTE — Telephone Encounter (Signed)
Fu call °Patient returning your call about blood work °

## 2012-01-01 NOTE — Telephone Encounter (Signed)
Pt aware of lab results and recommendations.  Copy of results mailed to pt.  Mylo Red RN

## 2012-01-22 ENCOUNTER — Other Ambulatory Visit: Payer: Self-pay | Admitting: Cardiology

## 2012-02-20 ENCOUNTER — Encounter: Payer: Self-pay | Admitting: *Deleted

## 2012-08-21 ENCOUNTER — Other Ambulatory Visit: Payer: Self-pay | Admitting: *Deleted

## 2012-08-21 MED ORDER — OMEGA-3-ACID ETHYL ESTERS 1 G PO CAPS: 1.0000 g | ORAL_CAPSULE | Freq: Four times a day (QID) | ORAL | Status: DC

## 2012-08-21 NOTE — Telephone Encounter (Signed)
Fax Received. Refill Completed. Peter Gray (R.M.A)   

## 2012-12-03 ENCOUNTER — Other Ambulatory Visit: Payer: Self-pay

## 2012-12-03 MED ORDER — RAMIPRIL 2.5 MG PO CAPS: 2.5000 mg | ORAL_CAPSULE | Freq: Every day | ORAL | Status: DC

## 2012-12-17 ENCOUNTER — Other Ambulatory Visit: Payer: Self-pay

## 2012-12-17 MED ORDER — OMEGA-3-ACID ETHYL ESTERS 1 G PO CAPS: 1.0000 g | ORAL_CAPSULE | Freq: Four times a day (QID) | ORAL | Status: DC

## 2012-12-23 ENCOUNTER — Ambulatory Visit (INDEPENDENT_AMBULATORY_CARE_PROVIDER_SITE_OTHER): Payer: BC Managed Care – PPO | Admitting: Family Medicine

## 2012-12-23 VITALS — BP 122/76 | HR 70 | Temp 97.8°F | Resp 18 | Ht 74.0 in | Wt 264.0 lb

## 2012-12-23 DIAGNOSIS — J111 Influenza due to unidentified influenza virus with other respiratory manifestations: Secondary | ICD-10-CM

## 2012-12-23 DIAGNOSIS — R6889 Other general symptoms and signs: Secondary | ICD-10-CM

## 2012-12-23 DIAGNOSIS — J019 Acute sinusitis, unspecified: Secondary | ICD-10-CM

## 2012-12-23 LAB — POCT INFLUENZA A/B
Influenza A, POC: NEGATIVE
Influenza B, POC: NEGATIVE

## 2012-12-23 MED ORDER — AMOXICILLIN-POT CLAVULANATE 875-125 MG PO TABS: 1.0000 | ORAL_TABLET | Freq: Two times a day (BID) | ORAL | Status: DC

## 2012-12-23 NOTE — Progress Notes (Signed)
[facility]:  Office Visit  Chief Complaint:  Chief Complaint  Patient presents with  . Sinusitis    left side - 2 days  . Dizziness    HPI: Peter Gray. is a 57 y.o. male who complains of 2 day history of left sided facial pain behind eyes, feels a little dizzy. Irritation of throat. No ear pain. Tried sudafed without relief, yellow dc from nose, dry cough. Msk tiredness and joint pain. Got flu vaccine this year. Denies fevers, chills, CP, SOB, palpitations.   Past Medical History  Diagnosis Date  . Hyperlipidemia   . Obesity   . Hypertension   . Coronary artery disease   . Sleep apnea   . Gout   . Myocardial infarction   . Arthritis    Past Surgical History  Procedure Laterality Date  . Coronary angioplasty with stent placement    . Elbow surgery    . Joint replacement    . Vasectomy     History   Social History  . Marital Status: Married    Spouse Name: N/A    Number of Children: N/A  . Years of Education: N/A   Social History Main Topics  . Smoking status: Never Smoker   . Smokeless tobacco: None  . Alcohol Use: Yes  . Drug Use: No  . Sexually Active: Yes    Birth Control/ Protection: Surgical   Other Topics Concern  . None   Social History Narrative  . None   Family History  Problem Relation Age of Onset  . Coronary artery disease Mother   . Breast cancer Mother   . Other Father   . Coronary artery disease Father   . Heart failure Father    No Known Allergies Prior to Admission medications   Medication Sig Start Date End Date Taking? Authorizing Provider  aspirin 325 MG tablet Take 325 mg by mouth daily.   Yes Historical Provider, MD  atorvastatin (LIPITOR) 80 MG tablet Take 1 tablet (80 mg total) by mouth daily. 12/24/11  Yes Peter M Swaziland, MD  b complex vitamins capsule Take 1 capsule by mouth daily.   Yes Historical Provider, MD  Cholecalciferol (VITAMIN D3) 3000 UNITS TABS Take 3,000 Units by mouth.   Yes Historical  Provider, MD  indomethacin (INDOCIN SR) 75 MG CR capsule Ad lib. 11/25/11  Yes Historical Provider, MD  metoprolol succinate (TOPROL-XL) 100 MG 24 hr tablet Take 1 tablet (100 mg total) by mouth daily. Take with or immediately following a meal. 12/24/11  Yes Peter M Swaziland, MD  Multiple Vitamin (MULTIVITAMIN) tablet Take 1 tablet by mouth daily.   Yes Historical Provider, MD  omega-3 acid ethyl esters (LOVAZA) 1 G capsule Take 1 capsule (1 g total) by mouth 4 (four) times daily. 12/17/12  Yes Peter M Swaziland, MD  ramipril (ALTACE) 2.5 MG capsule Take 1 capsule (2.5 mg total) by mouth daily. 12/03/12  Yes Peter M Swazil[place], MD  nitroGLYCERIN (NITROSTAT) 0.4 MG SL tablet Place 1 tablet (0.4 mg total) under the tongue every 5 (five) minutes as needed for chest pain. 09/11/11 09/10/12  Peter M Swaziland, MD     ROS: The patient denies fevers, night sweats, unintentional weight loss, chest pain, palpitations, wheezing, dyspnea on exertion, nausea, vomiting, abdominal pain, dysuria, hematuria, melena, numbness, weakness, or tingling.   All other systems have been reviewed and were otherwise negative with the exception of those mentioned in the HPI and as above.  PHYSICAL EXAM: Filed Vitals:   12/23/12 1256  BP: 122/76  Pulse: 70  Temp: 97.8 F (36.6 C)  Resp: 18   Filed Vitals:   12/23/12 1256  Height: 6\' 2"  (1.88 m)  Weight: 264 lb (119.75 kg)   Body mass index is 33.88 kg/(m^2).  General: Alert, no acute distress HEENT:  Normocephalic, atraumatic, oropharynx patent. + tender left max sinus > right. TM nl. No exudates. EOMI, PERRLA Cardiovascular:  Regular rate and rhythm, no rubs murmurs or gallops.  No Carotid bruits, radial pulse intact. No pedal edema.  Respiratory: Clear to auscultation bilaterally.  No wheezes, rales, or rhonchi.  No cyanosis, no use of accessory musculature GI: No organomegaly, abdomen is soft and non-tender, positive bowel sounds.  No masses. Skin: No rashes. Neurologic:  Facial musculature symmetric. Psychiatric: Patient is appropriate throughout our interaction. Lymphatic: No cervical lymphadenopathy Musculoskeletal: Gait intact.   LABS: Results for orders placed in visit on 12/23/12  POCT INFLUENZA A/B      Result Value Range   Influenza A, POC Negative     Influenza B, POC Negative       EKG/XRAY:   Primary read interpreted by Dr. Conley Rolls at [facility].   ASSESSMENT/PLAN: Encounter Diagnoses  Name Primary?  . Flu-like symptoms Yes  . 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 Also a very strong h/o CAD s/p MI, DM, HTN, XOL, OSA.  Rx Augmentin Advise patient to go see his PCP for routine follow-up, he acknowledges he is not a compliant patient.  F/u prn    LE, THAO PHUONG, DO 12/25/2012 8:36 AM

## 2013-01-01 ENCOUNTER — Other Ambulatory Visit: Payer: Self-pay

## 2013-01-01 MED ORDER — RAMIPRIL 2.5 MG PO CAPS: 2.5000 mg | ORAL_CAPSULE | Freq: Every day | ORAL | Status: DC

## 2013-01-01 NOTE — Telephone Encounter (Signed)
Called and spoke to patient and about his medication refills and he agreed to call Monday to schedule an appointment to receive further refills.

## 2013-01-12 ENCOUNTER — Encounter: Payer: Self-pay | Admitting: Nurse Practitioner

## 2013-01-12 ENCOUNTER — Ambulatory Visit (INDEPENDENT_AMBULATORY_CARE_PROVIDER_SITE_OTHER): Payer: BC Managed Care – PPO | Admitting: Nurse Practitioner

## 2013-01-12 VITALS — BP 110/78 | HR 62 | Ht 75.0 in | Wt 268.0 lb

## 2013-01-12 DIAGNOSIS — R7303 Prediabetes: Secondary | ICD-10-CM

## 2013-01-12 DIAGNOSIS — I2510 Atherosclerotic heart disease of native coronary artery without angina pectoris: Secondary | ICD-10-CM

## 2013-01-12 DIAGNOSIS — I259 Chronic ischemic heart disease, unspecified: Secondary | ICD-10-CM

## 2013-01-12 DIAGNOSIS — R7309 Other abnormal glucose: Secondary | ICD-10-CM

## 2013-01-12 MED ORDER — RAMIPRIL 2.5 MG PO CAPS: 2.5000 mg | ORAL_CAPSULE | Freq: Every day | ORAL | Status: DC

## 2013-01-12 NOTE — Patient Instructions (Addendum)
We will arrange for a stress Myoview  I refilled the Altace today  We will check labs on the day of your stress test  Try to be as active as possible - goal is 45 to 60 minutes per day  See Dr. Swaziland in a year  Call the [facility] office at [phone] if you have any questions, problems or concerns.

## 2013-01-12 NOTE — Progress Notes (Signed)
Peter Gray. Date of Birth: [birth year] Medical Record #[id number]  History of Present Illness: Peter Gray is seen back today for a one year check. He is seen for Dr. Swaziland. He has known CAD with lateral MI in 2008 and emergent stenting of the LCX with BMS. Normal nuclear in 2010. Other issues are HLD and OSA. His A1C was 6.9 last year.  Last seen a year ago. Dr. Swaziland suggested follow up stress testing this year.   He comes in today. He is here alone. He is doing ok. Has gained more weight. Not exercising. No chest pain. Not short of breath. Needs his medicines refilled. No labs since his last visit and he is not fasting today.   Current Outpatient Prescriptions on File Prior to Visit  Medication Sig Dispense Refill  . aspirin 325 MG tablet Take 325 mg by mouth daily.      Marland Kitchen atorvastatin (LIPITOR) 80 MG tablet Take 1 tablet (80 mg total) by mouth daily.  90 tablet  11  . b complex vitamins capsule Take 1 capsule by mouth daily.      . Cholecalciferol (VITAMIN D3) 3000 UNITS TABS Take 3,000 Units by mouth.      . indomethacin (INDOCIN SR) 75 MG CR capsule Ad lib.      . metoprolol succinate (TOPROL-XL) 100 MG 24 hr tablet Take 1 tablet (100 mg total) by mouth daily. Take with or immediately following a meal.  30 tablet  11  . Multiple Vitamin (MULTIVITAMIN) tablet Take 1 tablet by mouth daily.      . nitroGLYCERIN (NITROSTAT) 0.4 MG SL tablet Place 1 tablet (0.4 mg total) under the tongue every 5 (five) minutes as needed for chest pain.  25 tablet  3  . omega-3 acid ethyl esters (LOVAZA) 1 G capsule Take 1 capsule (1 g total) by mouth 4 (four) times daily.  120 capsule  1   No current facility-administered medications on file prior to visit.    No Known Allergies  Past Medical History  Diagnosis Date  . Hyperlipidemia   . Obesity   . Hypertension   . Coronary artery disease   . Sleep apnea   . Gout   . Myocardial infarction   . Arthritis     Past Surgical History    Procedure Laterality Date  . Coronary angioplasty with stent placement    . Elbow surgery    . Joint replacement    . Vasectomy      History  Smoking status  . Never Smoker   Smokeless tobacco  . Not on file    History  Alcohol Use  . Yes    Family History  Problem Relation Age of Onset  . Coronary artery disease Mother   . Breast cancer Mother   . Other Father   . Coronary artery disease Father   . Heart failure Father     Review of Systems: The review of systems is per the HPI.  All other systems were reviewed and are negative.  Physical Exam: BP 110/78  Pulse 62  Ht 6\' 3"  (1.905 m)  Wt 268 lb (121.564 kg)  BMI 33.5 kg/m2 Patient is very pleasant and in no acute distress. Skin is warm and dry. Color is normal.  HEENT is unremarkable. Normocephalic/atraumatic. PERRL. Sclera are nonicteric. Neck is supple. No masses. No JVD. Lungs are clear. Cardiac exam shows a regular rate and rhythm. Abdomen is soft. Extremities are without edema. Gait and  ROM are intact. No gross neurologic deficits noted.   LABORATORY DATA: Lab Results  Component Value Date   WBC 8.4 09/06/2009   HGB 10.9* 09/06/2009   HCT 31.2* 09/06/2009   PLT 157 09/06/2009   GLUCOSE 134* 12/25/2011   CHOL 168 12/25/2011   TRIG 138.0 12/25/2011   HDL 38.80* 12/25/2011   LDLCALC 102* 12/25/2011   ALT 30 12/25/2011   AST 30 12/25/2011   NA 136 12/25/2011   K 5.3* 12/25/2011   CL 100 12/25/2011   CREATININE 1.0 12/25/2011   BUN 21 12/25/2011   CO2 28 12/25/2011   PSA 0.60 12/25/2011   INR 1.17 09/06/2009   HGBA1C 6.8* 12/25/2011   Assessment / Plan: 1. CAD - no symptoms reported. Needs to work on CV risk factors but does not seem that motivated. Will update his Myoview. See Dr. Swaziland back in one year. Medicines are refilled today.   2. HLD - check fasting labs on the day of his Myoview.   3. Obesity - tried to be encourage life style changes.   4. Glucose intolerance - recheck A1C  Patient is  agreeable to this plan and will call if any problems develop in the interim.

## 2013-01-21 ENCOUNTER — Ambulatory Visit (HOSPITAL_COMMUNITY): Payer: BC Managed Care – PPO | Attending: Cardiology | Admitting: Radiology

## 2013-01-21 ENCOUNTER — Other Ambulatory Visit (INDEPENDENT_AMBULATORY_CARE_PROVIDER_SITE_OTHER): Payer: BC Managed Care – PPO

## 2013-01-21 VITALS — BP 118/74 | HR 67 | Ht 75.0 in | Wt 262.0 lb

## 2013-01-21 DIAGNOSIS — I2510 Atherosclerotic heart disease of native coronary artery without angina pectoris: Secondary | ICD-10-CM

## 2013-01-21 DIAGNOSIS — I259 Chronic ischemic heart disease, unspecified: Secondary | ICD-10-CM

## 2013-01-21 DIAGNOSIS — R7303 Prediabetes: Secondary | ICD-10-CM

## 2013-01-21 DIAGNOSIS — R7309 Other abnormal glucose: Secondary | ICD-10-CM

## 2013-01-21 DIAGNOSIS — E739 Lactose intolerance, unspecified: Secondary | ICD-10-CM | POA: Insufficient documentation

## 2013-01-21 DIAGNOSIS — I4949 Other premature depolarization: Secondary | ICD-10-CM

## 2013-01-21 LAB — LIPID PANEL
Cholesterol: 154 mg/dL (ref 0–200)
HDL: 33.8 mg/dL — ABNORMAL LOW (ref >39.00)
LDL Cholesterol: 96 mg/dL (ref 0–99)
Total CHOL/HDL Ratio: 5
Triglycerides: 123 mg/dL (ref 0.0–149.0)
VLDL: 24.6 mg/dL (ref 0.0–40.0)

## 2013-01-21 LAB — CBC WITH DIFFERENTIAL/PLATELET
Basophils Absolute: 0 10*3/uL (ref 0.0–0.1)
Basophils Relative: 0.5 % (ref 0.0–3.0)
Eosinophils Absolute: 0.1 10*3/uL (ref 0.0–0.7)
Eosinophils Relative: 1.5 % (ref 0.0–5.0)
HCT: 41.4 % (ref 39.0–52.0)
Hemoglobin: 14.3 g/dL (ref 13.0–17.0)
Lymphocytes Relative: 35.1 % (ref 12.0–46.0)
Lymphs Abs: 2.3 10*3/uL (ref 0.7–4.0)
MCHC: 34.5 g/dL (ref 30.0–36.0)
MCV: 90.4 fl (ref 78.0–100.0)
Monocytes Absolute: 0.5 10*3/uL (ref 0.1–1.0)
Monocytes Relative: 7.5 % (ref 3.0–12.0)
Neutro Abs: 3.7 10*3/uL (ref 1.4–7.7)
Neutrophils Relative %: 55.4 % (ref 43.0–77.0)
Platelets: 187 10*3/uL (ref 150.0–400.0)
RBC: 4.58 Mil/uL (ref 4.22–5.81)
RDW: 12.8 % (ref 11.5–14.6)
WBC: 6.7 10*3/uL (ref 4.5–10.5)

## 2013-01-21 LAB — BASIC METABOLIC PANEL
BUN: 27 mg/dL — ABNORMAL HIGH (ref 6–23)
CO2: 22 mEq/L (ref 19–32)
Calcium: 9.4 mg/dL (ref 8.4–10.5)
Chloride: 100 mEq/L (ref 96–112)
Creatinine, Ser: 1.2 mg/dL (ref 0.4–1.5)
GFR: 67.26 mL/min (ref >60.00)
Glucose, Bld: 156 mg/dL — ABNORMAL HIGH (ref 70–99)
Potassium: 3.9 mEq/L (ref 3.5–5.1)
Sodium: 134 mEq/L — ABNORMAL LOW (ref 135–145)

## 2013-01-21 LAB — HEPATIC FUNCTION PANEL
ALT: 39 U/L (ref 0–53)
AST: 37 U/L (ref 0–37)
Albumin: 4.2 g/dL (ref 3.5–5.2)
Alkaline Phosphatase: 59 U/L (ref 39–117)
Bilirubin, Direct: 0.2 mg/dL (ref 0.0–0.3)
Total Bilirubin: 1 mg/dL (ref 0.3–1.2)
Total Protein: 7 g/dL (ref 6.0–8.3)

## 2013-01-21 LAB — HEMOGLOBIN A1C: Hgb A1c MFr Bld: 8.1 % — ABNORMAL HIGH (ref 4.6–6.5)

## 2013-01-21 LAB — PSA: PSA: 0.41 ng/mL (ref 0.10–4.00)

## 2013-01-21 MED ORDER — TECHNETIUM TC 99M SESTAMIBI GENERIC - CARDIOLITE
11.0000 | Freq: Once | INTRAVENOUS | Status: AC | PRN
  Administered 2013-01-21: 11 via INTRAVENOUS

## 2013-01-21 MED ORDER — TECHNETIUM TC 99M SESTAMIBI GENERIC - CARDIOLITE
33.0000 | Freq: Once | INTRAVENOUS | Status: AC | PRN
  Administered 2013-01-21: 33 via INTRAVENOUS

## 2013-01-21 NOTE — Progress Notes (Signed)
[facility] NUCLEAR MED [address] [place], [place] [address] [phone]    Cardiology Nuclear Med Study  Peter Gray. is a 57 y.o. male     MRN : [id number]     DOB: 24-Apr-1956  Procedure Date: 01/21/2013  Nuclear Med Background Indication for Stress Test:  Evaluation for Ischemia and Stent Patency History:  '08 LWMI>Stent-CFX; '10 QMV:HQIONG, EF=58% Cardiac Risk Factors: Hypertension, Lipids and Obesity  Symptoms:  No current cardiac symptoms.   Nuclear Pre-Procedure Caffeine/Decaff Intake:  None NPO After: 8:00pm   Lungs:  Clear. O2 Sat: 98% on room air. IV 0.9% NS with Angio Cath:  22g  IV Site: R Forearm  IV Started by:  Stanton Kidney, EMT-P  Chest Size (in):  46 Cup Size: n/a  Height: 6\' 3"  (1.905 m)  Weight:  262 lb (118.842 kg)  BMI:  Body mass index is 32.75 kg/(m^2). Tech Comments:  Toprol held > 36 hours, per patient.    Nuclear Med Study 1 or 2 day study: 1 day  Stress Test Type:  Stress  Reading MD: Marca Ancona, MD  Order Authorizing Provider:  Peter Swaziland, MD  Resting Radionuclide: Technetium 65m Sestamibi  Resting Radionuclide Dose: 11.0 mCi   Stress Radionuclide:  Technetium 76m Sestamibi  Stress Radionuclide Dose: 33.0 mCi           Stress Protocol Rest HR: 67 Stress HR: 153  Rest BP: 118/74 Stress BP: 193/79  Exercise Time (min): 7:30 METS: 8.8   Predicted Max HR: 165 bpm % Max HR: 92.73 bpm Rate Pressure Product: 29528   Dose of Adenosine (mg):  n/a Dose of Lexiscan: n/a mg  Dose of Atropine (mg): n/a Dose of Dobutamine: n/a mcg/kg/min (at max HR)  Stress Test Technologist: Smiley Houseman, CMA-N  Nuclear Technologist:  Domenic Polite, CNMT     Rest Procedure:  Myocardial perfusion imaging was performed at rest 45 minutes following the intravenous administration of Technetium 17m Sestamibi.  Rest ECG: NSR - Normal EKG  Stress Procedure:  The patient exercised on the treadmill utilizing the Bruce Protocol for 7:30  minutes. The patient stopped due to dyspnea and fatigue.  He denied any chest pain.  Technetium 22m Sestamibi was injected at peak exercise and myocardial perfusion imaging was performed after a brief delay.  Stress ECG: No significant change from baseline ECG  QPS Raw Data Images:  Normal; no motion artifact; normal heart/lung ratio. Stress Images:  Normal homogeneous uptake in all areas of the myocardium. Rest Images:  Small, mild mid anterior perfusion defect.  Subtraction (SDS):  No evidence of ischemia. Transient Ischemic Dilatation (Normal <1.22):  1.18 Lung/Heart Ratio (Normal <0.45):  0.35  Quantitative Gated Spect Images QGS EDV:  136 ml QGS ESV:  63 ml  Impression Exercise Capacity:  Good exercise capacity. BP Response:  Hypertensive blood pressure response. Clinical Symptoms:  Dyspnea, fatigue, no chest pain ECG Impression:  Insignificant upsloping ST segment depression. Comparison with Prior Nuclear Study: No images to compare  Overall Impression:  Low risk stress nuclear study.  There is a small, mild mid-anterior perfusion defect at rest but stress images actually look normal.  No evidence for ischemia or infarction.   LV Ejection Fraction: 54%.  LV Wall Motion:  NL LV Function; NL Wall Motion  Marca Ancona 01/21/2013

## 2013-01-25 ENCOUNTER — Other Ambulatory Visit: Payer: Self-pay | Admitting: Emergency Medicine

## 2013-01-25 MED ORDER — METOPROLOL SUCCINATE ER 100 MG PO TB24: 100.0000 mg | ORAL_TABLET | Freq: Every day | ORAL | Status: DC

## 2013-01-26 ENCOUNTER — Other Ambulatory Visit: Payer: Self-pay | Admitting: *Deleted

## 2013-01-26 MED ORDER — ATORVASTATIN CALCIUM 80 MG PO TABS: 80.0000 mg | ORAL_TABLET | Freq: Every day | ORAL | Status: DC

## 2013-01-27 ENCOUNTER — Other Ambulatory Visit: Payer: Self-pay | Admitting: *Deleted

## 2013-01-27 MED ORDER — RAMIPRIL 2.5 MG PO CAPS: 2.5000 mg | ORAL_CAPSULE | Freq: Every day | ORAL | Status: DC

## 2013-03-08 ENCOUNTER — Ambulatory Visit: Payer: BC Managed Care – PPO | Admitting: Cardiology

## 2013-08-23 ENCOUNTER — Other Ambulatory Visit: Payer: Self-pay

## 2013-08-23 MED ORDER — OMEGA-3-ACID ETHYL ESTERS 1 G PO CAPS: 1.0000 g | ORAL_CAPSULE | Freq: Four times a day (QID) | ORAL | Status: DC

## 2013-09-02 ENCOUNTER — Encounter: Payer: Self-pay | Admitting: Cardiology

## 2013-09-21 ENCOUNTER — Other Ambulatory Visit: Payer: Self-pay

## 2013-09-21 MED ORDER — OMEGA-3-ACID ETHYL ESTERS 1 G PO CAPS: 1.0000 g | ORAL_CAPSULE | Freq: Four times a day (QID) | ORAL | Status: DC

## 2013-10-19 ENCOUNTER — Other Ambulatory Visit: Payer: Self-pay | Admitting: Internal Medicine

## 2013-11-18 ENCOUNTER — Other Ambulatory Visit: Payer: Self-pay | Admitting: Cardiology

## 2013-12-22 ENCOUNTER — Other Ambulatory Visit: Payer: Self-pay | Admitting: Cardiology

## 2013-12-27 ENCOUNTER — Telehealth: Payer: Self-pay | Admitting: Cardiology

## 2013-12-27 NOTE — Telephone Encounter (Signed)
New message    Pt has a stent.  Can he go with his wife to have a MRI and sit in the room with her?  Also, for future information---can he have a MRI since he has a stent?

## 2013-12-27 NOTE — Telephone Encounter (Signed)
He is OK to be in or near an MRI with his stent.  Juleen Sorrels [place] MD, [facility]

## 2013-12-27 NOTE — Telephone Encounter (Signed)
Returned call to patient he stated he wanted to know if ok to be in the room when his wife has a MRI. Message sent to [place] for advice.

## 2013-12-30 NOTE — Telephone Encounter (Signed)
Received call back from patient Dr.Jordan advised ok to be in or near MRI.

## 2013-12-30 NOTE — Telephone Encounter (Signed)
Returned call to patient no answer.LMTC. 

## 2014-01-18 ENCOUNTER — Other Ambulatory Visit: Payer: Self-pay | Admitting: *Deleted

## 2014-01-18 MED ORDER — ATORVASTATIN CALCIUM 80 MG PO TABS: 80.0000 mg | ORAL_TABLET | Freq: Every day | ORAL | Status: DC

## 2014-01-20 ENCOUNTER — Other Ambulatory Visit: Payer: Self-pay | Admitting: Cardiology

## 2014-01-28 ENCOUNTER — Encounter: Payer: Self-pay | Admitting: Cardiology

## 2014-01-28 ENCOUNTER — Ambulatory Visit (INDEPENDENT_AMBULATORY_CARE_PROVIDER_SITE_OTHER): Payer: BC Managed Care – PPO | Admitting: Cardiology

## 2014-01-28 VITALS — BP 122/64 | HR 62 | Ht 75.0 in | Wt 264.8 lb

## 2014-01-28 DIAGNOSIS — I10 Essential (primary) hypertension: Secondary | ICD-10-CM

## 2014-01-28 DIAGNOSIS — E785 Hyperlipidemia, unspecified: Secondary | ICD-10-CM

## 2014-01-28 DIAGNOSIS — I2510 Atherosclerotic heart disease of native coronary artery without angina pectoris: Secondary | ICD-10-CM

## 2014-01-28 LAB — CBC WITH DIFFERENTIAL/PLATELET
BASOS ABS: 0 10*3/uL (ref 0.0–0.1)
Basophils Relative: 0.4 % (ref 0.0–3.0)
EOS ABS: 0.1 10*3/uL (ref 0.0–0.7)
Eosinophils Relative: 1.4 % (ref 0.0–5.0)
HCT: 44.5 % (ref 39.0–52.0)
HEMOGLOBIN: 15 g/dL (ref 13.0–17.0)
LYMPHS PCT: 34.9 % (ref 12.0–46.0)
Lymphs Abs: 2.3 10*3/uL (ref 0.7–4.0)
MCHC: 33.7 g/dL (ref 30.0–36.0)
MCV: 91.1 fl (ref 78.0–100.0)
MONOS PCT: 6.6 % (ref 3.0–12.0)
Monocytes Absolute: 0.4 10*3/uL (ref 0.1–1.0)
NEUTROS PCT: 56.7 % (ref 43.0–77.0)
Neutro Abs: 3.7 10*3/uL (ref 1.4–7.7)
PLATELETS: 249 10*3/uL (ref 150.0–400.0)
RBC: 4.88 Mil/uL (ref 4.22–5.81)
RDW: 12.9 % (ref 11.5–14.6)
WBC: 6.5 10*3/uL (ref 4.5–10.5)

## 2014-01-28 LAB — HEPATIC FUNCTION PANEL
ALBUMIN: 4.3 g/dL (ref 3.5–5.2)
ALT: 29 U/L (ref 0–53)
AST: 28 U/L (ref 0–37)
Alkaline Phosphatase: 93 U/L (ref 39–117)
Bilirubin, Direct: 0.1 mg/dL (ref 0.0–0.3)
Total Bilirubin: 0.9 mg/dL (ref 0.3–1.2)
Total Protein: 7.6 g/dL (ref 6.0–8.3)

## 2014-01-28 LAB — BASIC METABOLIC PANEL
BUN: 19 mg/dL (ref 6–23)
CHLORIDE: 100 meq/L (ref 96–112)
CO2: 27 mEq/L (ref 19–32)
Calcium: 9.9 mg/dL (ref 8.4–10.5)
Creatinine, Ser: 1.1 mg/dL (ref 0.4–1.5)
GFR: 70.41 mL/min (ref >60.00)
Glucose, Bld: 226 mg/dL — ABNORMAL HIGH (ref 70–99)
Potassium: 4.4 mEq/L (ref 3.5–5.1)
Sodium: 135 mEq/L (ref 135–145)

## 2014-01-28 LAB — LIPID PANEL
CHOLESTEROL: 150 mg/dL (ref 0–200)
HDL: 37.4 mg/dL — ABNORMAL LOW (ref >39.00)
LDL CALC: 83 mg/dL (ref 0–99)
TRIGLYCERIDES: 149 mg/dL (ref 0.0–149.0)
Total CHOL/HDL Ratio: 4
VLDL: 29.8 mg/dL (ref 0.0–40.0)

## 2014-01-28 LAB — PSA: PSA: 0.75 ng/mL (ref 0.10–4.00)

## 2014-01-28 LAB — HEMOGLOBIN A1C: HEMOGLOBIN A1C: 9.4 % — AB (ref 4.6–6.5)

## 2014-01-28 NOTE — Patient Instructions (Signed)
We will check lab work today  Continue your current therapy  I will see you in one year. 

## 2014-01-29 NOTE — Progress Notes (Signed)
Peter Gray. Date of Birth: Sep 23, 1956 Medical Record #[id number]  History of Present Illness: Peter Gray is seen back today for a follow up visit. He has known CAD with lateral MI in 2008 and emergent stenting of the LCX with BMS. Normal nuclear study in March 2014. Other issues are HLD and OSA. On follow up today he reports he is feeling well. Denies any chest pain or SOB. No increase thirst. Weight is down 4 lbs. Prior labs showed incresed A1c from 6.8>8.1%. Follow up with primary was recommended but patient states he has not seen his primary in several years. Doesn't exercise much.  Current Outpatient Prescriptions on File Prior to Visit  Medication Sig Dispense Refill  . aspirin 325 MG tablet Take 325 mg by mouth daily.      Marland Kitchen atorvastatin (LIPITOR) 80 MG tablet Take 1 tablet (80 mg total) by mouth daily.  90 tablet  0  . b complex vitamins capsule Take 1 capsule by mouth daily.      . Cholecalciferol (VITAMIN D3) 3000 UNITS TABS Take 3,000 Units by mouth.      . indomethacin (INDOCIN SR) 75 MG CR capsule Ad lib.      . metoprolol succinate (TOPROL-XL) 100 MG 24 hr tablet TAKE 1 TABLET BY MOUTH DAILY OR IMMEDIATELY FOLLOWING A MEAL  30 tablet  0  . metoprolol succinate (TOPROL-XL) 100 MG 24 hr tablet TAKE 1 TABLET BY MOUTH EVERY DAY FOLLOWING A MEAL  30 tablet  0  . Multiple Vitamin (MULTIVITAMIN) tablet Take 1 tablet by mouth daily.      . nitroGLYCERIN (NITROSTAT) 0.4 MG SL tablet Place 1 tablet (0.4 mg total) under the tongue every 5 (five) minutes as needed for chest pain.  25 tablet  3  . omega-3 acid ethyl esters (LOVAZA) 1 G capsule TAKE 1 CAPSULE BY MOUTH FOUR TIMES DAILY  120 capsule  0  . ramipril (ALTACE) 2.5 MG capsule Take 1 capsule (2.5 mg total) by mouth daily.  90 capsule  3   No current facility-administered medications on file prior to visit.    No Known Allergies  Past Medical History  Diagnosis Date  . Hyperlipidemia   . Obesity   . Hypertension   . Coronary  artery disease   . Sleep apnea   . Gout   . Myocardial infarction   . Arthritis     Past Surgical History  Procedure Laterality Date  . Coronary angioplasty with stent placement    . Elbow surgery    . Joint replacement    . Vasectomy      History  Smoking status  . Never Smoker   Smokeless tobacco  . Not on file    History  Alcohol Use  . Yes    Family History  Problem Relation Age of Onset  . Coronary artery disease Mother   . Breast cancer Mother   . Other Father   . Coronary artery disease Father   . Heart failure Father     Review of Systems: The review of systems is per the HPI.  All other systems were reviewed and are negative.  Physical Exam: BP 122/64  Pulse 62  Ht 6\' 3"  (1.905 m)  Wt 264 lb 12.8 oz (120.112 kg)  BMI 33.10 kg/m2 Patient is  pleasant and in no acute distress. Skin is warm and dry. Color is normal.  HEENT is unremarkable. Normocephalic/atraumatic. PERRL. Sclera are nonicteric. Neck is supple. No masses. No JVD.  Lungs are clear. Cardiac exam shows a regular rate and rhythm. Abdomen is soft. Extremities are without edema. Gait and ROM are intact. No gross neurologic deficits noted.   LABORATORY DATA: Lab Results  Component Value Date   WBC 6.5 01/28/2014   HGB 15.0 01/28/2014   HCT 44.5 01/28/2014   PLT 249.0 01/28/2014   GLUCOSE 226* 01/28/2014   CHOL 150 01/28/2014   TRIG 149.0 01/28/2014   HDL 37.40* 01/28/2014   LDLCALC 83 01/28/2014   ALT 29 01/28/2014   AST 28 01/28/2014   NA 135 01/28/2014   K 4.4 01/28/2014   CL 100 01/28/2014   CREATININE 1.1 01/28/2014   BUN 19 01/28/2014   CO2 27 01/28/2014   PSA 0.75 01/28/2014   INR 1.17 09/06/2009   HGBA1C 9.4* 01/28/2014   Ecg: NSR, normal.   Assessment / Plan: 1. CAD - no symptoms reported. Needs to work on CV risk factors particularly diabetes control. Myoview in March last year was normal.  Medicines are refilled today. I will follow up in one year.  2. HLD - lipids acceptable. Continue  high dose statin.   3. Obesity - tried to be encourage life style changes.   4. Diabetes mellitus. Poorly controlled. A1c 6.8>>8.1>>9.4. Strongly recommend follow up with Dr. Reynaldo Minium to treat.

## 2014-02-23 ENCOUNTER — Other Ambulatory Visit: Payer: Self-pay | Admitting: Cardiology

## 2014-03-28 ENCOUNTER — Other Ambulatory Visit: Payer: Self-pay | Admitting: Cardiovascular Disease

## 2014-04-16 ENCOUNTER — Other Ambulatory Visit: Payer: Self-pay | Admitting: Cardiology

## 2014-07-04 ENCOUNTER — Other Ambulatory Visit: Payer: Self-pay | Admitting: Cardiology

## 2014-07-19 ENCOUNTER — Other Ambulatory Visit: Payer: Self-pay | Admitting: Cardiology

## 2014-07-25 ENCOUNTER — Other Ambulatory Visit (INDEPENDENT_AMBULATORY_CARE_PROVIDER_SITE_OTHER): Payer: Self-pay | Admitting: Surgery

## 2014-07-25 ENCOUNTER — Ambulatory Visit (INDEPENDENT_AMBULATORY_CARE_PROVIDER_SITE_OTHER): Payer: BC Managed Care – PPO | Admitting: Surgery

## 2014-08-05 ENCOUNTER — Telehealth: Payer: Self-pay | Admitting: Cardiology

## 2014-08-05 NOTE — Telephone Encounter (Signed)
New message           Pt needs surgical clearance for stomach repair

## 2014-08-05 NOTE — Telephone Encounter (Signed)
Returned call to patient.Dr.Jordan cleared you for upcoming umbilical hernia repair.He did advise will need general medical clearance since diabetes poorly controlled. Note faxed to [facility] 08/02/14.Note refaxed to them again today 08/05/14.

## 2014-08-11 ENCOUNTER — Telehealth: Payer: Self-pay

## 2014-08-11 NOTE — Telephone Encounter (Signed)
Received surgical clearance form from Dr.Tsuei's office.Dr.Jordan cleared patient for surgery.He advised to get a medical clearance due to poorly controlled diabetes.Form faxed back to fax # [phone].

## 2014-08-17 ENCOUNTER — Encounter (HOSPITAL_COMMUNITY): Payer: Self-pay

## 2014-08-22 ENCOUNTER — Encounter (HOSPITAL_COMMUNITY)
Admission: RE | Admit: 2014-08-22 | Discharge: 2014-08-22 | Disposition: A | Discharge status: Home / Self Care | Payer: BC Managed Care – PPO | Source: Ambulatory Visit | Attending: Surgery | Admitting: Surgery

## 2014-08-22 ENCOUNTER — Encounter (HOSPITAL_COMMUNITY): Payer: Self-pay

## 2014-08-22 ENCOUNTER — Other Ambulatory Visit: Payer: Self-pay | Admitting: Cardiology

## 2014-08-22 ENCOUNTER — Other Ambulatory Visit: Payer: Self-pay

## 2014-08-22 ENCOUNTER — Ambulatory Visit (HOSPITAL_COMMUNITY)
Admission: RE | Admit: 2014-08-22 | Discharge: 2014-08-22 | Disposition: A | Discharge status: Home / Self Care | Payer: BC Managed Care – PPO | Source: Ambulatory Visit | Attending: Surgery | Admitting: Surgery

## 2014-08-22 DIAGNOSIS — Z01818 Encounter for other preprocedural examination: Secondary | ICD-10-CM

## 2014-08-22 DIAGNOSIS — E119 Type 2 diabetes mellitus without complications: Secondary | ICD-10-CM | POA: Insufficient documentation

## 2014-08-22 DIAGNOSIS — K429 Umbilical hernia without obstruction or gangrene: Secondary | ICD-10-CM | POA: Diagnosis not present

## 2014-08-22 DIAGNOSIS — I252 Old myocardial infarction: Secondary | ICD-10-CM | POA: Diagnosis not present

## 2014-08-22 HISTORY — DX: Encounter for other specified aftercare: Z51.89

## 2014-08-22 HISTORY — DX: Gastro-esophageal reflux disease without esophagitis: K21.9

## 2014-08-22 HISTORY — DX: Myoneural disorder, unspecified: G70.9

## 2014-08-22 HISTORY — DX: Inflammatory liver disease, unspecified: K75.9

## 2014-08-22 HISTORY — DX: Type 2 diabetes mellitus without complications: E11.9

## 2014-08-22 LAB — CBC
HEMATOCRIT: 38.2 % — AB (ref 39.0–52.0)
Hemoglobin: 13.3 g/dL (ref 13.0–17.0)
MCH: 32.8 pg (ref 26.0–34.0)
MCHC: 34.8 g/dL (ref 30.0–36.0)
MCV: 94.3 fL (ref 78.0–100.0)
Platelets: 183 10*3/uL (ref 150–400)
RBC: 4.05 MIL/uL — ABNORMAL LOW (ref 4.22–5.81)
RDW: 12.5 % (ref 11.5–15.5)
WBC: 6.7 10*3/uL (ref 4.0–10.5)

## 2014-08-22 LAB — BASIC METABOLIC PANEL
Anion gap: 20 — ABNORMAL HIGH (ref 5–15)
BUN: 31 mg/dL — ABNORMAL HIGH (ref 6–23)
CALCIUM: 9.7 mg/dL (ref 8.4–10.5)
CHLORIDE: 97 meq/L (ref 96–112)
CO2: 19 mEq/L (ref 19–32)
CREATININE: 1.88 mg/dL — AB (ref 0.50–1.35)
GFR calc Af Amer: 44 mL/min — ABNORMAL LOW (ref >90)
GFR calc non Af Amer: 38 mL/min — ABNORMAL LOW (ref >90)
Glucose, Bld: 100 mg/dL — ABNORMAL HIGH (ref 70–99)
POTASSIUM: 5.1 meq/L (ref 3.7–5.3)
SODIUM: 136 meq/L — AB (ref 137–147)

## 2014-08-22 MED ORDER — ATORVASTATIN CALCIUM 80 MG PO TABS: 80.0000 mg | ORAL_TABLET | Freq: Every day | ORAL | Status: DC

## 2014-08-22 MED ORDER — OMEGA-3-ACID ETHYL ESTERS 1 G PO CAPS: 4.0000 g | ORAL_CAPSULE | Freq: Every day | ORAL | Status: DC

## 2014-08-22 MED ORDER — RAMIPRIL 2.5 MG PO CAPS: 2.5000 mg | ORAL_CAPSULE | Freq: Every day | ORAL | Status: DC

## 2014-08-22 NOTE — Progress Notes (Signed)
Faxed request to Dr. P. Martin[place], relative to managenment of NSAID, fish oil & ASA prior to surgery

## 2014-08-22 NOTE — Pre-Procedure Instructions (Signed)
Peter Gray.  08/22/2014   Your procedure is scheduled on:  08/31/2014  Report to [facility] Admitting   ENTRANCE A  at 7:30 AM.  Call this number if you have problems the morning of surgery: [phone]   Remember:   Do not eat food or drink liquids after midnight.  On Tuesday- the 20th   Take these medicines the morning of surgery with A SIP OF WATER: Pepcid   Do not wear jewelry  Do not wear lotions, powders, or perfumes. You may wear deodorant.             Men may shave face and neck.  Do not bring valuables to the hospital.  [facility] is not responsible for any belongings or valuables.               Contacts, dentures or bridgework may not be worn into surgery.  Leave suitcase in the car. After surgery it may be brought to your room.  For patients admitted to the hospital, discharge time is determined by your                treatment team.               Patients discharged the day of surgery will not be allowed to drive  home.  Name and phone number of your driver: with  Spouse   Special Instructions: Special Instructions: [place] - Preparing for Surgery  Before surgery, you can play an important role.  Because skin is not sterile, your skin needs to be as free of germs as possible.  You can reduce the number of germs on you skin by washing with CHG (chlorahexidine gluconate) soap before surgery.  CHG is an antiseptic cleaner which kills germs and bonds with the skin to continue killing germs even after washing.  Please DO NOT use if you have an allergy to CHG or antibacterial soaps.  If your skin becomes reddened/irritated stop using the CHG and inform your nurse when you arrive at Short Stay.  Do not shave (including legs and underarms) for at least 48 hours prior to the first CHG shower.  You may shave your face.  Please follow these instructions carefully:   1.  Shower with CHG Soap the night before surgery and the  morning of Surgery.  2.  If you  choose to wash your hair, wash your hair first as usual with your  normal shampoo.  3.  After you shampoo, rinse your hair and body thoroughly to remove the  Shampoo.  4.  Use CHG as you would any other liquid soap.  You can apply chg directly to the skin and wash gently with scrungie or a clean washcloth.  5.  Apply the CHG Soap to your body ONLY FROM THE NECK DOWN.    Do not use on open wounds or open sores.  Avoid contact with your eyes, ears, mouth and genitals (private parts).  Wash genitals (private parts)   with your normal soap.  6.  Wash thoroughly, paying special attention to the area where your surgery will be performed.  7.  Thoroughly rinse your body with warm water from the neck down.  8.  DO NOT shower/wash with your normal soap after using and rinsing off   the CHG Soap.  9.  Pat yourself dry with a clean towel.            10.  Wear clean pajamas.  11.  Place clean sheets on your bed the night of your first shower and do not sleep with pets.  Day of Surgery  Do not apply any lotions/deodorants the morning of surgery.  Please wear clean clothes to the hospital/surgery center.   Please read over the following fact sheets that you were given: Pain Booklet, Coughing and Deep Breathing and Surgical Site Infection Prevention

## 2014-08-23 NOTE — Progress Notes (Addendum)
Anesthesia Chart Review:  Patient is a 58 year old male scheduled for UHR on 08/31/14 by Dr. Georgette Dover.    History includes non-smoker, CAD/lateral MI s/p emergent stent LCX (BMS) '08, HTN, HLD, DM2, OSA with CPAP use, hepatitis (not specified), arthritis, gout, left THA '10.  BMI is consistent with obesity. PCP is Dr. Reynaldo Minium. Cardiologist is Dr. Peter Martinique who cleared patient for surgery from a cardiac standpoint, but also recommended medical clearance due to his poorly controlled diabetes.    EKG on 01/28/14 showed: NSR.  Nuclear stress test on 01/21/13 showed: Overall Impression: Low risk stress nuclear study. There is a small, mild mid-anterior perfusion defect at rest but stress images actually look normal. No evidence for ischemia or infarction. LV Ejection Fraction: 54%. LV Wall Motion: NL LV Function; NL Wall Motion.  Cardiac cath on 4/40/08 showed: up to 20% mid LAD, occluded proximal CX with right to left collaterals to CX from the RCA, up to 20% proximal, mid, and distal RCA. Hypokinesia on the inferior and lateral wall with overall mild LV dysfunction. EF estimated 45%. S/P LCX PTCA/stent.  CXR on 08/22/14 showed: No active cardiopulmonary disease.  Preoperative labs noted.  Glucose 100.  BUN/Cr 31/1.88, up from 19/1.1 on 01/28/14. H/H 13.3/38.2.  (A1C on 01/28/14 was 9.4.)  Records and most recent labs for comparison requested from [facility].  I'll follow-up once received.  George Hugh [facility] Short Stay Center/Anesthesiology Phone [phone] 08/23/2014 5:01 PM  Addendum: Records received from Dr. Jacquiline Doe. Last visit was 06/29/14.  No labs done there per medical records.  I spoke with [facility] triage nurse at [place].  She does not yet see a medical clearance note from Dr. Lynne Logan she will make sure a letter requesting medical clearance is faxed.  I told her his renal function tests were elevated, so I will contact Dr. Jacquiline Doe office with these results.  I  called and left a message with Dr. Jacquiline Doe nurse Jacob Moores regarding labs and plans for surgery and faxed labs results with confirmation.    George Hugh [facility] Short Stay Center/Anesthesiology Phone [phone] 08/24/2014 10:15 AM  Addendum:  I received a phone call from Dr. Jacquiline Doe nurse Danae Chen.  She reports patient came in for labs on 08/29/14 with improvement in his Cr, however his K was 6.1.  He had a repeat K this morning, which was reportedly better.  Nurse Danae Chen told me that since his labs had improved Dr. Reynaldo Minium felt patient was okay to proceed with surgery tomorrow.  She is suppose to fax his labs to [phone]. Hopefully, we will receive before his surgery tomorrow, otherwise he may need an ISTAT8 on arrival.    George Hugh [facility] Short Stay Center/Anesthesiology Phone [phone] 08/30/2014 3:18 PM

## 2014-08-30 MED ORDER — CHLORHEXIDINE GLUCONATE 4 % EX LIQD
1.0000 "application " | Freq: Once | CUTANEOUS | Status: DC
  Filled 2014-08-30: qty 15

## 2014-08-30 MED ORDER — DEXTROSE 5 % IV SOLN
3.0000 g | INTRAVENOUS | Status: AC
  Administered 2014-08-31: 3 g via INTRAVENOUS
  Filled 2014-08-30: qty 3000

## 2014-08-31 ENCOUNTER — Ambulatory Visit (HOSPITAL_COMMUNITY): Payer: BC Managed Care – PPO | Admitting: Critical Care Medicine

## 2014-08-31 ENCOUNTER — Encounter (HOSPITAL_COMMUNITY)
Admission: RE | Disposition: A | Discharge status: Home / Self Care | Payer: Self-pay | Source: Ambulatory Visit | Attending: Surgery

## 2014-08-31 ENCOUNTER — Ambulatory Visit (HOSPITAL_COMMUNITY)
Admission: RE | Admit: 2014-08-31 | Discharge: 2014-09-01 | Disposition: A | Discharge status: Home / Self Care | Payer: BC Managed Care – PPO | Source: Ambulatory Visit | Attending: Surgery | Admitting: Surgery

## 2014-08-31 ENCOUNTER — Encounter (HOSPITAL_COMMUNITY): Payer: BC Managed Care – PPO | Admitting: Vascular Surgery

## 2014-08-31 ENCOUNTER — Encounter (HOSPITAL_COMMUNITY): Payer: Self-pay | Admitting: Critical Care Medicine

## 2014-08-31 DIAGNOSIS — M1990 Unspecified osteoarthritis, unspecified site: Secondary | ICD-10-CM | POA: Diagnosis not present

## 2014-08-31 DIAGNOSIS — D649 Anemia, unspecified: Secondary | ICD-10-CM | POA: Diagnosis not present

## 2014-08-31 DIAGNOSIS — G4730 Sleep apnea, unspecified: Secondary | ICD-10-CM | POA: Diagnosis not present

## 2014-08-31 DIAGNOSIS — K219 Gastro-esophageal reflux disease without esophagitis: Secondary | ICD-10-CM | POA: Diagnosis not present

## 2014-08-31 DIAGNOSIS — E119 Type 2 diabetes mellitus without complications: Secondary | ICD-10-CM | POA: Insufficient documentation

## 2014-08-31 DIAGNOSIS — K429 Umbilical hernia without obstruction or gangrene: Secondary | ICD-10-CM | POA: Diagnosis present

## 2014-08-31 DIAGNOSIS — I2510 Atherosclerotic heart disease of native coronary artery without angina pectoris: Secondary | ICD-10-CM | POA: Diagnosis not present

## 2014-08-31 DIAGNOSIS — I10 Essential (primary) hypertension: Secondary | ICD-10-CM | POA: Diagnosis not present

## 2014-08-31 HISTORY — PX: UMBILICAL HERNIA REPAIR: SHX2598

## 2014-08-31 HISTORY — PX: INSERTION OF MESH: SHX5868

## 2014-08-31 HISTORY — PX: UMBILICAL HERNIA REPAIR: SHX196

## 2014-08-31 LAB — BASIC METABOLIC PANEL
Anion gap: 12 (ref 5–15)
BUN: 21 mg/dL (ref 6–23)
CALCIUM: 9.6 mg/dL (ref 8.4–10.5)
CO2: 28 mEq/L (ref 19–32)
Chloride: 102 mEq/L (ref 96–112)
Creatinine, Ser: 1.13 mg/dL (ref 0.50–1.35)
GFR calc Af Amer: 82 mL/min — ABNORMAL LOW (ref >90)
GFR calc non Af Amer: 70 mL/min — ABNORMAL LOW (ref >90)
GLUCOSE: 96 mg/dL (ref 70–99)
Potassium: 4.9 mEq/L (ref 3.7–5.3)
Sodium: 142 mEq/L (ref 137–147)

## 2014-08-31 LAB — GLUCOSE, CAPILLARY
Glucose-Capillary: 120 mg/dL — ABNORMAL HIGH (ref 70–99)
Glucose-Capillary: 124 mg/dL — ABNORMAL HIGH (ref 70–99)
Glucose-Capillary: 100 mg/dL — ABNORMAL HIGH (ref 70–99)
Glucose-Capillary: 118 mg/dL — ABNORMAL HIGH (ref 70–99)
Glucose-Capillary: 97 mg/dL (ref 70–99)

## 2014-08-31 SURGERY — REPAIR, HERNIA, UMBILICAL, ADULT
Anesthesia: General

## 2014-08-31 MED ORDER — DOCUSATE SODIUM 100 MG PO CAPS
100.0000 mg | ORAL_CAPSULE | Freq: Every day | ORAL | Status: DC
  Administered 2014-08-31 – 2014-09-01 (×2): 100 mg via ORAL
  Filled 2014-08-31 (×2): qty 1

## 2014-08-31 MED ORDER — METOPROLOL SUCCINATE ER 100 MG PO TB24
100.0000 mg | ORAL_TABLET | Freq: Every day | ORAL | Status: DC
  Administered 2014-08-31: 100 mg via ORAL
  Filled 2014-08-31 (×2): qty 1

## 2014-08-31 MED ORDER — PROPOFOL 10 MG/ML IV BOLUS
INTRAVENOUS | Status: AC
  Filled 2014-08-31: qty 20

## 2014-08-31 MED ORDER — ROCURONIUM BROMIDE 50 MG/5ML IV SOLN
INTRAVENOUS | Status: AC
  Filled 2014-08-31: qty 1

## 2014-08-31 MED ORDER — FENTANYL CITRATE 0.05 MG/ML IJ SOLN
INTRAMUSCULAR | Status: DC | PRN
  Administered 2014-08-31 (×2): 25 ug via INTRAVENOUS

## 2014-08-31 MED ORDER — 0.9 % SODIUM CHLORIDE (POUR BTL) OPTIME
TOPICAL | Status: DC | PRN
  Administered 2014-08-31: 1000 mL

## 2014-08-31 MED ORDER — BUPIVACAINE-EPINEPHRINE 0.5% -1:200000 IJ SOLN
INTRAMUSCULAR | Status: DC | PRN
  Administered 2014-08-31: 30 mL

## 2014-08-31 MED ORDER — RAMIPRIL 2.5 MG PO CAPS
2.5000 mg | ORAL_CAPSULE | Freq: Every day | ORAL | Status: DC
  Administered 2014-08-31: 2.5 mg via ORAL
  Filled 2014-08-31 (×2): qty 1

## 2014-08-31 MED ORDER — HYDROMORPHONE HCL 1 MG/ML IJ SOLN: 0.2500 mg | INTRAMUSCULAR | Status: DC | PRN

## 2014-08-31 MED ORDER — INSULIN ASPART 100 UNIT/ML ~~LOC~~ SOLN
4.0000 [IU] | Freq: Three times a day (TID) | SUBCUTANEOUS | Status: DC
  Administered 2014-08-31 – 2014-09-01 (×2): 4 [IU] via SUBCUTANEOUS

## 2014-08-31 MED ORDER — LIDOCAINE HCL (CARDIAC) 20 MG/ML IV SOLN
INTRAVENOUS | Status: AC
  Filled 2014-08-31: qty 5

## 2014-08-31 MED ORDER — ONDANSETRON HCL 4 MG PO TABS: 4.0000 mg | ORAL_TABLET | Freq: Four times a day (QID) | ORAL | Status: DC | PRN

## 2014-08-31 MED ORDER — FENTANYL CITRATE 0.05 MG/ML IJ SOLN
INTRAMUSCULAR | Status: AC
  Filled 2014-08-31: qty 5

## 2014-08-31 MED ORDER — SUCCINYLCHOLINE CHLORIDE 20 MG/ML IJ SOLN
INTRAMUSCULAR | Status: AC
  Filled 2014-08-31: qty 1

## 2014-08-31 MED ORDER — EPHEDRINE SULFATE 50 MG/ML IJ SOLN
INTRAMUSCULAR | Status: AC
  Filled 2014-08-31: qty 1

## 2014-08-31 MED ORDER — MIDAZOLAM HCL 5 MG/5ML IJ SOLN
INTRAMUSCULAR | Status: DC | PRN
  Administered 2014-08-31: 2 mg via INTRAVENOUS

## 2014-08-31 MED ORDER — MIDAZOLAM HCL 2 MG/2ML IJ SOLN
INTRAMUSCULAR | Status: AC
  Filled 2014-08-31: qty 2

## 2014-08-31 MED ORDER — ENOXAPARIN SODIUM 30 MG/0.3ML ~~LOC~~ SOLN
30.0000 mg | SUBCUTANEOUS | Status: DC
  Administered 2014-09-01: 30 mg via SUBCUTANEOUS
  Filled 2014-08-31 (×2): qty 0.3

## 2014-08-31 MED ORDER — CEFAZOLIN SODIUM 1-5 GM-% IV SOLN
1.0000 g | Freq: Three times a day (TID) | INTRAVENOUS | Status: AC
  Administered 2014-08-31: 1 g via INTRAVENOUS
  Filled 2014-08-31: qty 50

## 2014-08-31 MED ORDER — OXYCODONE-ACETAMINOPHEN 5-325 MG PO TABS
1.0000 | ORAL_TABLET | ORAL | Status: DC | PRN
  Administered 2014-08-31 – 2014-09-01 (×3): 2 via ORAL
  Filled 2014-08-31 (×3): qty 2

## 2014-08-31 MED ORDER — ONDANSETRON HCL 4 MG/2ML IJ SOLN: 4.0000 mg | Freq: Four times a day (QID) | INTRAMUSCULAR | Status: DC | PRN

## 2014-08-31 MED ORDER — METFORMIN HCL 500 MG PO TABS: 500.0000 mg | ORAL_TABLET | Freq: Every day | ORAL | Status: DC

## 2014-08-31 MED ORDER — MORPHINE SULFATE 2 MG/ML IJ SOLN: 2.0000 mg | INTRAMUSCULAR | Status: DC | PRN

## 2014-08-31 MED ORDER — SODIUM CHLORIDE 0.45 % IV SOLN
INTRAVENOUS | Status: DC
  Administered 2014-08-31: 15:00:00 via INTRAVENOUS

## 2014-08-31 MED ORDER — OXYCODONE HCL 5 MG PO TABS: 5.0000 mg | ORAL_TABLET | Freq: Once | ORAL | Status: DC | PRN

## 2014-08-31 MED ORDER — ATORVASTATIN CALCIUM 80 MG PO TABS
80.0000 mg | ORAL_TABLET | Freq: Every day | ORAL | Status: DC
  Administered 2014-08-31: 80 mg via ORAL
  Filled 2014-08-31 (×2): qty 1

## 2014-08-31 MED ORDER — FAMOTIDINE 20 MG PO TABS
20.0000 mg | ORAL_TABLET | Freq: Two times a day (BID) | ORAL | Status: DC
  Administered 2014-08-31 – 2014-09-01 (×3): 20 mg via ORAL
  Filled 2014-08-31 (×4): qty 1

## 2014-08-31 MED ORDER — NITROGLYCERIN 0.4 MG SL SUBL: 0.4000 mg | SUBLINGUAL_TABLET | SUBLINGUAL | Status: DC | PRN

## 2014-08-31 MED ORDER — SODIUM CHLORIDE 0.9 % IJ SOLN
INTRAMUSCULAR | Status: AC
  Filled 2014-08-31: qty 10

## 2014-08-31 MED ORDER — INSULIN ASPART 100 UNIT/ML ~~LOC~~ SOLN: 0.0000 [IU] | Freq: Three times a day (TID) | SUBCUTANEOUS | Status: DC

## 2014-08-31 MED ORDER — OXYCODONE HCL 5 MG/5ML PO SOLN: 5.0000 mg | Freq: Once | ORAL | Status: DC | PRN

## 2014-08-31 MED ORDER — BUPIVACAINE-EPINEPHRINE (PF) 0.5% -1:200000 IJ SOLN
INTRAMUSCULAR | Status: AC
  Filled 2014-08-31: qty 30

## 2014-08-31 MED ORDER — PROPOFOL 10 MG/ML IV BOLUS
INTRAVENOUS | Status: DC | PRN
  Administered 2014-08-31: 180 mg via INTRAVENOUS

## 2014-08-31 MED ORDER — ONDANSETRON HCL 4 MG/2ML IJ SOLN
INTRAMUSCULAR | Status: DC | PRN
  Administered 2014-08-31: 4 mg via INTRAVENOUS

## 2014-08-31 MED ORDER — LIDOCAINE HCL (CARDIAC) 20 MG/ML IV SOLN
INTRAVENOUS | Status: DC | PRN
  Administered 2014-08-31: 80 mg via INTRAVENOUS

## 2014-08-31 MED ORDER — LACTATED RINGERS IV SOLN
INTRAVENOUS | Status: DC
  Administered 2014-08-31: 08:00:00 via INTRAVENOUS

## 2014-08-31 SURGICAL SUPPLY — 52 items
APL SKNCLS STERI-STRIP NONHPOA (GAUZE/BANDAGES/DRESSINGS) ×1
BENZOIN TINCTURE PRP APPL 2/3 (GAUZE/BANDAGES/DRESSINGS) ×2 IMPLANT
BLADE SURG 10 STRL SS (BLADE) ×1 IMPLANT
BLADE SURG 15 STRL LF DISP TIS (BLADE) IMPLANT
BLADE SURG 15 STRL SS (BLADE) ×2
BLADE SURG ROTATE 9660 (MISCELLANEOUS) IMPLANT
CANISTER SUCTION 2500CC (MISCELLANEOUS) IMPLANT
CHLORAPREP W/TINT 26ML (MISCELLANEOUS) ×2 IMPLANT
COVER SURGICAL LIGHT HANDLE (MISCELLANEOUS) ×2 IMPLANT
DRAPE PED LAPAROTOMY (DRAPES) ×2 IMPLANT
DRAPE UTILITY 15X26 W/TAPE STR (DRAPE) ×4 IMPLANT
DRSG TEGADERM 2-3/8X2-3/4 SM (GAUZE/BANDAGES/DRESSINGS) ×1 IMPLANT
ELECT CAUTERY BLADE 6.4 (BLADE) ×2 IMPLANT
ELECT REM PT RETURN 9FT ADLT (ELECTROSURGICAL) ×2
ELECTRODE REM PT RTRN 9FT ADLT (ELECTROSURGICAL) ×1 IMPLANT
GAUZE SPONGE 2X2 8PLY STRL LF (GAUZE/BANDAGES/DRESSINGS) IMPLANT
GAUZE SPONGE 4X4 12PLY STRL (GAUZE/BANDAGES/DRESSINGS) ×2 IMPLANT
GAUZE SPONGE 4X4 16PLY XRAY LF (GAUZE/BANDAGES/DRESSINGS) ×2 IMPLANT
GLOVE BIO SURGEON STRL SZ7 (GLOVE) ×2 IMPLANT
GLOVE BIOGEL PI IND STRL 6.5 (GLOVE) IMPLANT
GLOVE BIOGEL PI IND STRL 7.0 (GLOVE) IMPLANT
GLOVE BIOGEL PI IND STRL 7.5 (GLOVE) ×1 IMPLANT
GLOVE BIOGEL PI INDICATOR 6.5 (GLOVE) ×1
GLOVE BIOGEL PI INDICATOR 7.0 (GLOVE) ×1
GLOVE BIOGEL PI INDICATOR 7.5 (GLOVE) ×2
GLOVE SURG SS PI 6.5 STRL IVOR (GLOVE) ×1 IMPLANT
GLOVE SURG SS PI 7.0 STRL IVOR (GLOVE) ×1 IMPLANT
GOWN STRL REUS W/ TWL LRG LVL3 (GOWN DISPOSABLE) ×2 IMPLANT
GOWN STRL REUS W/TWL LRG LVL3 (GOWN DISPOSABLE) ×4
KIT BASIN OR (CUSTOM PROCEDURE TRAY) ×2 IMPLANT
KIT ROOM TURNOVER OR (KITS) ×2 IMPLANT
MESH VENTRALEX ST 1-7/10 CRC S (Mesh General) ×1 IMPLANT
NDL HYPO 25GX1X1/2 BEV (NEEDLE) ×1 IMPLANT
NEEDLE HYPO 25GX1X1/2 BEV (NEEDLE) ×2 IMPLANT
NS IRRIG 1000ML POUR BTL (IV SOLUTION) ×2 IMPLANT
PACK SURGICAL SETUP 50X90 (CUSTOM PROCEDURE TRAY) ×2 IMPLANT
PAD ARMBOARD 7.5X6 YLW CONV (MISCELLANEOUS) ×4 IMPLANT
PENCIL BUTTON HOLSTER BLD 10FT (ELECTRODE) ×2 IMPLANT
SPONGE GAUZE 2X2 STER 10/PKG (GAUZE/BANDAGES/DRESSINGS) ×1
SPONGE LAP 18X18 X RAY DECT (DISPOSABLE) ×2 IMPLANT
STRIP CLOSURE SKIN 1/2X4 (GAUZE/BANDAGES/DRESSINGS) ×2 IMPLANT
SUT MNCRL AB 4-0 PS2 18 (SUTURE) ×2 IMPLANT
SUT NOVA NAB GS-21 0 18 T12 DT (SUTURE) ×4 IMPLANT
SUT VIC AB 3-0 SH 27 (SUTURE) ×2
SUT VIC AB 3-0 SH 27X BRD (SUTURE) ×1 IMPLANT
SYR BULB 3OZ (MISCELLANEOUS) ×2 IMPLANT
SYR CONTROL 10ML LL (SYRINGE) ×2 IMPLANT
TOWEL OR 17X24 6PK STRL BLUE (TOWEL DISPOSABLE) ×2 IMPLANT
TOWEL OR 17X26 10 PK STRL BLUE (TOWEL DISPOSABLE) ×2 IMPLANT
TUBE CONNECTING 12X1/4 (SUCTIONS) IMPLANT
WATER STERILE IRR 1000ML POUR (IV SOLUTION) IMPLANT
YANKAUER SUCT BULB TIP NO VENT (SUCTIONS) IMPLANT

## 2014-08-31 NOTE — Anesthesia Preprocedure Evaluation (Addendum)
Anesthesia Evaluation  Patient identified by MRN, date of birth, ID band Patient awake    Reviewed: Allergy & Precautions, H&P , NPO status , Patient's Chart, lab work & pertinent test results, reviewed documented beta blocker date and time   History of Anesthesia Complications Negative for: history of anesthetic complications  Airway Mallampati: II TM Distance: >3 FB Neck ROM: Full    Dental  (+) Dental Advisory Given, Teeth Intact   Pulmonary sleep apnea and Continuous Positive Airway Pressure Ventilation , neg COPD breath sounds clear to auscultation        Cardiovascular hypertension, Pt. on medications and Pt. on home beta blockers + CAD, + Past MI, + Cardiac Stents and +CHF Rhythm:Regular     Neuro/Psych  Neuromuscular disease negative psych ROS   GI/Hepatic Neg liver ROS, GERD-  Medicated and Controlled,  Endo/Other  diabetes, Type 2, Oral Hypoglycemic Agents  Renal/GU Renal InsufficiencyRenal disease     Musculoskeletal  (+) Arthritis -,   Abdominal   Peds  Hematology  (+) anemia ,   Anesthesia Other Findings Nuclear stress test on 01/21/13 showed: Overall Impression: Low risk stress nuclear study. There is a small, mild mid-anterior perfusion defect at rest but stress images actually look normal. No evidence for ischemia or infarction. LV Ejection Fraction: 54%. LV Wall Motion: NL LV Function; NL Wall Motion.   Cardiac cath on 4/40/08 showed: up to 20% mid LAD, occluded proximal CX with right to left collaterals to CX from the RCA, up to 20% proximal, mid, and distal RCA. Hypokinesia on the inferior and lateral wall with overall mild LV dysfunction. EF estimated 45%. S/P LCX PTCA/stent.   CXR on 08/22/14 showed: No active cardiopulmonary disease.   Preoperative labs noted.  Glucose 100.  BUN/Cr 31/1.88, up from 19/1.1 on 01/28/14. H/H 13.3/38.2.  (A1C on 01/28/14 was 9.4.)   Records and most recent labs for  comparison requested from [facility].  I'll follow-up once received.   George Hugh [facility] Short Stay Center/Anesthesiology Phone [phone] 08/23/2014 5:01 PM   Addendum: Records received from Dr. Jacquiline Doe. Last visit was 06/29/14.  No labs done there per medical records.  I spoke with [facility] triage nurse at [place].  She does not yet see a medical clearance note from Dr. Lynne Logan she will make sure a letter requesting medical clearance is faxed.  I told her his renal function tests were elevated, so I will contact Dr. Jacquiline Doe office with these results.  I called and left a message with Dr. Jacquiline Doe nurse Jacob Moores regarding labs and plans for surgery and faxed labs results with confirmation.     George Hugh [facility] Short Stay Center/Anesthesiology Phone [phone] 08/24/2014 10:15 AM   Addendum:   I received a phone call from Dr. Jacquiline Doe nurse Danae Chen.  She reports patient came in for labs on 08/29/14 with improvement in his Cr, however his K was 6.1.  He had a repeat K this morning, which was reportedly better.  Nurse Danae Chen told me that since his labs had improved Dr. Reynaldo Minium felt patient was okay to proceed with surgery tomorrow.  She is suppose to fax his labs to [phone]. Hopefully, we will receive before his surgery tomorrow, otherwise he may need an ISTAT8 on arrival.       Reproductive/Obstetrics                          Anesthesia Physical Anesthesia Plan  ASA:  III  Anesthesia Plan: General   Post-op Pain Management:    Induction: Intravenous  Airway Management Planned: Oral ETT  Additional Equipment: None  Intra-op Plan:   Post-operative Plan: Extubation in OR  Informed Consent: I have reviewed the patients History and Physical, chart, labs and discussed the procedure including the risks, benefits and alternatives for the proposed anesthesia with the patient or authorized representative who has  indicated his/her understanding and acceptance.   Dental advisory given  Plan Discussed with: CRNA and Surgeon  Anesthesia Plan Comments:         Anesthesia Quick Evaluation

## 2014-08-31 NOTE — Transfer of Care (Signed)
Immediate Anesthesia Transfer of Care Note  Patient: Peter Gray.  Procedure(s) Performed: Procedure(s): HERNIA REPAIR UMBILICAL ADULT (N/A) INSERTION OF MESH (N/A)  Patient Location: PACU  Anesthesia Type:General  Level of Consciousness: awake, alert  and oriented  Airway & Oxygen Therapy: Patient Spontanous Breathing and Patient connected to nasal cannula oxygen  Post-op Assessment: Report given to PACU RN, Post -op Vital signs reviewed and stable and Patient moving all extremities X 4  Post vital signs: Reviewed and stable  Complications: No apparent anesthesia complications

## 2014-08-31 NOTE — Progress Notes (Signed)
Report given to Robin RN

## 2014-08-31 NOTE — Op Note (Signed)
Indications:  The patient presented with a history of an enlarging umbilical hernia.  The patient was examined and we recommended umbilical hernia repair with mesh.  Pre-operative diagnosis:  Umbilical hernia  Post-operative diagnosis:  Same  Surgeon: Taha Dimond K.   Assistants: none  Anesthesia: General endotracheal anesthesia  ASA Class: 2   Procedure Details  The patient was seen again in the Holding Room. The risks, benefits, complications, treatment options, and expected outcomes were discussed with the patient. The possibilities of reaction to medication, pulmonary aspiration, perforation of viscus, bleeding, recurrent infection, the need for additional procedures, and development of a complication requiring transfusion or further operation were discussed with the patient and/or family. There was concurrence with the proposed plan, and informed consent was obtained. The site of surgery was properly noted/marked. The patient was taken to the Operating Room, identified as Peter Gray., and the procedure verified as umbilical hernia repair. A Time Out was held and the above information confirmed.  After an adequate level of general anesthesia was obtained, the patient's abdomen was prepped with Chloraprep and draped in sterile fashion.  We made a transverse incision below the umbilicus.  Dissection was carried down to the hernia sac with cautery.  We dissected bluntly around the hernia sac down to the edge of the fascial defect.  We reduced the hernia sac back into the pre-peritoneal space.  The fascial defect measured 1.5 cm.  We cleared the fascia in all directions.  A small Ventralex mesh was inserted into the pre-peritoneal space and was deployed.  The mesh was secured with four trans-fascial sutures of 0 Novofil.  The fascial defect was closed with multiple interrupted figure-of-eight 0 Novofil sutures.  The base of the umbilicus was tacked down with 3-0 Vicryl.  3-0 Vicryl was used  to close the subcutaneous tissues and 4-0 Monocryl was used to close the skin.  Steri-strips and clean dressing were applied.  The patient was extubated and brought to the recovery room in stable condition.  All sponge, instrument, and needle counts were correct prior to closure and at the conclusion of the case.   Estimated Blood Loss: Minimal          Complications: None; patient tolerated the procedure well.         Disposition: PACU - hemodynamically stable.         Condition: stable  Imogene Burn. Georgette Dover, MD, [facility]  General/ Trauma Surgery  08/31/2014 10:23 AM

## 2014-08-31 NOTE — Progress Notes (Signed)
PHARMACIST - PHYSICIAN COMMUNICATION  CONCERNING:  METFORMIN SAFE ADMINISTRATION POLICY  RECOMMENDATION: Metformin has been placed on DISCONTINUE (rejected order) STATUS and should be reordered only after any of the conditions below are ruled out.  Current safety recommendations include avoiding metformin for a minimum of 48 hours after the patient's exposure to intravenous contrast media.  DESCRIPTION:  The Pharmacy Committee has adopted a policy that restricts the use of metformin in hospitalized patients until all the contraindications to administration have been ruled out. Specific contraindications are: [x]  Serum creatinine ? 1.5 for males []  Serum creatinine ? 1.4 for females []  Shock, acute MI, sepsis, hypoxemia, dehydration []  Planned administration of intravenous iodinated contrast media []  Heart Failure patients with low EF []  Acute or chronic metabolic acidosis (including DKA)     Eudelia Bunch, Pharm.D. [phone] 08/31/2014 2:14 PM

## 2014-08-31 NOTE — Anesthesia Postprocedure Evaluation (Signed)
  Anesthesia Post-op Note  Patient: Peter Gray.  Procedure(s) Performed: Procedure(s): HERNIA REPAIR UMBILICAL ADULT (N/A) INSERTION OF MESH (N/A)  Patient Location: PACU  Anesthesia Type:General  Level of Consciousness: awake  Airway and Oxygen Therapy: Patient Spontanous Breathing  Post-op Pain: none  Post-op Assessment: Post-op Vital signs reviewed, Patient's Cardiovascular Status Stable, Respiratory Function Stable, Patent Airway, No signs of Nausea or vomiting and Pain level controlled  Post-op Vital Signs: Reviewed and stable  Last Vitals:  Filed Vitals:   08/31/14 1300  BP: 113/69  Pulse: 53  Temp: 36.4 C  Resp: 17    Complications: No apparent anesthesia complications

## 2014-08-31 NOTE — Progress Notes (Signed)
Pt decided not to bring glasses to OR

## 2014-08-31 NOTE — Progress Notes (Addendum)
No labs received...will draw an IStat 8. Pt refused to have labs redrawn, stating his K was 5.1 yesterday.  I will talk with anesthesia and attempt to receive copy of labs when office opens.

## 2014-08-31 NOTE — H&P (Signed)
History of Present Illness Peter Gray. Jlen Wintle MD; 07/25/2014 12:38 PM) The patient is a 58 year old male who presents with an umbilical hernia.  Note:this is a 58 year old male who presents with a two-year history of any visible umbilical hernia. This seems to fluctuate in size.It causes some minimal discomfort. He denies any obstructive symptoms. Recently he had an episode where he became fairly large and he became concerned. He brought her to the attention of Dr. Reynaldo Minium who has now referred him for surgical evaluation for repair.   Other Problems Lenoria Farrier; 07/25/2014 9:27 AM) Arthritis Chest pain Diabetes Mellitus Hypercholesterolemia Myocardial infarction Other disease, cancer, significant illness Sleep Apnea Umbilical Hernia Repair  Past Surgical History Lenoria Farrier; 07/25/2014 9:27 AM) Hip Surgery Left. Vasectomy  Diagnostic Studies History Lenoria Farrier; 07/25/2014 9:27 AM) Colonoscopy never  Allergies Apolonio Schneiders [place]; 07/25/2014 9:28 AM) No Known Drug Allergies09/14/2015  Medication History Apolonio Schneiders [place]; 07/25/2014 9:30 AM) Lipitor (80MG  Tablet, Oral daily) Active. Metoprolol Tartrate (100MG  Tablet, Oral daily) Active. Altace (2.5MG  Capsule, Oral daily) Active. Lovaza (1GM Capsule, Oral daily) Active. MetFORMIN HCl ER (500MG  Tablet ER 24HR, Oral daily) Active. Flonase (50MCG/ACT Suspension, Nasal as needed) Active. Indocin SR (75MG  Capsule ER, Oral two times daily) Active.  Social History Lenoria Farrier; 07/25/2014 9:27 AM) Alcohol use Occasional alcohol use. Caffeine use Tea. Illicit drug use Prefer to discuss with provider. Tobacco use Never smoker.  Family History Lenoria Farrier; 07/25/2014 9:27 AM) Arthritis Father, Mother. Breast Cancer Mother. Heart Disease Father, Mother. Hypertension Father. Migraine Headache Daughter. Prostate Cancer Father.  Review of Systems Apolonio Schneiders [place]; 07/25/2014 9:27  AM) General Present- Fatigue and Weight Loss. Not Present- Appetite Loss, Chills, Fever, Night Sweats and Weight Gain. Skin Not Present- Change in Wart/Mole, Dryness, Hives, Jaundice, New Lesions, Non-Healing Wounds, Rash and Ulcer. HEENT Present- Wears glasses/contact lenses. Not Present- Earache, Hearing Loss, Hoarseness, Nose Bleed, Oral Ulcers, Ringing in the Ears, Seasonal Allergies, Sinus Pain, Sore Throat, Visual Disturbances and Yellow Eyes. Respiratory Present- Snoring. Not Present- Bloody sputum, Chronic Cough, Difficulty Breathing and Wheezing. Breast Not Present- Breast Mass, Breast Pain, Nipple Discharge and Skin Changes. Cardiovascular Not Present- Chest Pain, Difficulty Breathing Lying Down, Leg Cramps, Palpitations, Rapid Heart Rate, Shortness of Breath and Swelling of Extremities. Gastrointestinal Not Present- Abdominal Pain, Bloating, Bloody Stool, Change in Bowel Habits, Chronic diarrhea, Constipation, Difficulty Swallowing, Excessive gas, Gets full quickly at meals, Hemorrhoids, Indigestion, Nausea, Rectal Pain and Vomiting. Male Genitourinary Present- Change in Urinary Stream. Not Present- Blood in Urine, Frequency, Impotence, Nocturia, Painful Urination, Urgency and Urine Leakage. Musculoskeletal Present- Joint Stiffness. Not Present- Back Pain, Joint Pain, Muscle Pain, Muscle Weakness and Swelling of Extremities. Neurological Not Present- Decreased Memory, Fainting, Headaches, Numbness, Seizures, Tingling, Tremor, Trouble walking and Weakness. Psychiatric Not Present- Anxiety, Bipolar, Change in Sleep Pattern, Depression, Fearful and Frequent crying. Endocrine Present- New Diabetes. Not Present- Cold Intolerance, Excessive Hunger, Hair Changes, Heat Intolerance and Hot flashes. Hematology Not Present- Easy Bruising, Excessive bleeding, Gland problems, HIV and Persistent Infections.   Physical Exam Rodman Key K. Pinki Rottman MD; 07/25/2014 12:38 PM) The physical exam findings are as  follows: Note:WDWN in NAD HEENT: EOMI, sclera anicteric Neck: No masses, no thyromegaly Lungs: CTA bilaterally; normal respiratory effort CV: Regular rate and rhythm; no murmurs Abd: +bowel sounds, soft, non-tender, protruding umbilicus; reducible hernia Ext: Well-perfused; no edema Skin: Warm, dry; no sign of jaundice    Assessment & Plan Rodman Key K. Marquie Aderhold MD; 07/25/2014 12:38 PM) HERNIA, UMBILICAL (253.6  U44.9) Current Plans  Schedule for Surgery Note:after cardiac clearance  Recommend umbilical hernia repair with mesh. The surgical procedure has been discussed with the patient. Potential risks, benefits, alternative treatments, and expected outcomes have been explained. All of the patient's questions at this time have been answered. The likelihood of reaching the patient's treatment goal is good. The patient understand the proposed surgical procedure and wishes to proceed.  Peter Gray. Georgette Dover, MD, [facility]  General/ Trauma Surgery  08/31/2014 9:38 AM

## 2014-08-31 NOTE — Anesthesia Procedure Notes (Signed)
Procedure Name: LMA Insertion Date/Time: 08/31/2014 9:40 AM Performed by: Carola Frost Pre-anesthesia Checklist: Patient identified, Timeout performed, Emergency Drugs available, Suction available and Patient being monitored Patient Re-evaluated:Patient Re-evaluated prior to inductionOxygen Delivery Method: Circle system utilized Preoxygenation: Pre-oxygenation with 100% oxygen Intubation Type: IV induction Ventilation: Mask ventilation without difficulty LMA: LMA inserted LMA Size: 5.0 Placement Confirmation: positive ETCO2,  CO2 detector and breath sounds checked- equal and bilateral Tube secured with: Tape Dental Injury: Teeth and Oropharynx as per pre-operative assessment

## 2014-08-31 NOTE — Progress Notes (Signed)
Placed patient on CPAP via nasal mask, auto titrate settings.  Patient tolerating well at this time. RN aware.  

## 2014-09-01 DIAGNOSIS — K429 Umbilical hernia without obstruction or gangrene: Secondary | ICD-10-CM | POA: Diagnosis not present

## 2014-09-01 LAB — GLUCOSE, CAPILLARY: GLUCOSE-CAPILLARY: 102 mg/dL — AB (ref 70–99)

## 2014-09-01 MED ORDER — OXYCODONE-ACETAMINOPHEN 5-325 MG PO TABS: 1.0000 | ORAL_TABLET | ORAL | Status: DC | PRN

## 2014-09-01 NOTE — Discharge Instructions (Signed)
[facility] ° °UMBILICAL HERNIA REPAIR: POST OP INSTRUCTIONS ° °Always review your discharge instruction sheet given to you by the facility where your surgery was performed. °IF YOU HAVE DISABILITY OR FAMILY LEAVE FORMS, YOU MUST BRING THEM TO THE OFFICE FOR PROCESSING.   °DO NOT GIVE THEM TO YOUR DOCTOR. ° °1. A  prescription for pain medication may be given to you upon discharge.  Take your pain medication as prescribed, if needed.  If narcotic pain medicine is not needed, then you may take acetaminophen (Tylenol) or ibuprofen (Advil) as needed. °2. Take your usually prescribed medications unless otherwise directed. °3. If you need a refill on your pain medication, please contact your pharmacy.  They will contact our office to request authorization. Prescriptions will not be filled after 5 pm or on week-ends. °4. You should follow a light diet the first 24 hours after arrival home, such as soup and crackers, etc.  Be sure to include lots of fluids daily.  Resume your normal diet the day after surgery. °5. Most patients will experience some swelling and bruising around the umbilicus or in the groin and scrotum.  Ice packs and reclining will help.  Swelling and bruising can take several days to resolve.  °6. It is common to experience some constipation if taking pain medication after surgery.  Increasing fluid intake and taking a stool softener (such as Colace) will usually help or prevent this problem from occurring.  A mild laxative (Milk of Magnesia or Miralax) should be taken according to package directions if there are no bowel movements after 48 hours. °7. Unless discharge instructions indicate otherwise, you may remove your bandages 24-48 hours after surgery, and you may shower at that time.  You will have steri-strips (small skin tapes) in place directly over the incision.  These strips should be left on the skin for 7-10 days. °8. ACTIVITIES:  You may resume regular (light) daily activities  beginning the next day--such as daily self-care, walking, climbing stairs--gradually increasing activities as tolerated.  You may have sexual intercourse when it is comfortable.  Refrain from any heavy lifting or straining until approved by your doctor. °a. You may drive when you are no longer taking prescription pain medication, you can comfortably wear a seatbelt, and you can safely maneuver your car and apply brakes. °b. RETURN TO WORK:  2-3 weeks with light duty - no lifting over 15 lbs. °9. You should see your doctor in the office for a follow-up appointment approximately 2-3 weeks after your surgery.  Make sure that you call for this appointment within a day or two after you arrive home to insure a convenient appointment time. °10. OTHER INSTRUCTIONS:  __________________________________________________________________________________________________________________________________________________________________________________________  °WHEN TO CALL YOUR DOCTOR: °1. Fever over 101.0 °2. Inability to urinate °3. Nausea and/or vomiting °4. Extreme swelling or bruising °5. Continued bleeding from incision. °6. Increased pain, redness, or drainage from the incision ° °The clinic staff is available to answer your questions during regular business hours.  Please don’t hesitate to call and ask to speak to one of the nurses for clinical concerns.  If you have a medical emergency, go to the nearest emergency room or call 911.  A surgeon from [facility] is always on call at the hospital ° ° °[address], [address], [place], [place]  [address] ? ° [address] [address], [place], [place]   [address] °[phone]    [phone]    FAX [phone] °Web site: [email or web address] ° ° °

## 2014-09-01 NOTE — Progress Notes (Signed)
Patient discharged to home with instructions, verbalized understanding. 

## 2014-09-01 NOTE — Discharge Summary (Signed)
Physician Discharge Summary  Patient ID: Peter Gray. MRN: [id number] DOB/AGE: [birth year] 57 y.o.  Admit date: 08/31/2014 Discharge date: 09/01/2014  Admission Diagnoses:   Umbilical hernia without obstruction or gangrene    Coronary artery disease    Diabetes mellitus  Discharge Diagnoses: same Active Problems:   Umbilical hernia without obstruction and without gangrene   Discharged Condition: good  Hospital Course: Umbilical hernia repair with mesh on 10/21.  Kept overnight for observation because of cardiac history.  No significant events overnight.  Pain well-controlled with Percocet  Consults: None  Significant Diagnostic Studies: labs:  Lab Results  Component Value Date   CREATININE 1.13 08/31/2014   BUN 21 08/31/2014   NA 142 08/31/2014   K 4.9 08/31/2014   CL 102 08/31/2014   CO2 28 08/31/2014     Treatments: surgery: umb hernia repair with mesh  Discharge Exam: Blood pressure 122/48, pulse 52, temperature 97.4 F (36.3 C), temperature source Oral, resp. rate 15, height 6\' 3"  (1.905 m), weight 235 lb (106.595 kg), SpO2 100.00%. General appearance: alert, cooperative and no distress GI: soft, periumbilical tenderness Mild bruising around incision  Disposition:   Discharge Instructions   Call MD for:  persistant nausea and vomiting    Complete by:  As directed      Call MD for:  redness, tenderness, or signs of infection (pain, swelling, redness, odor or green/yellow discharge around incision site)    Complete by:  As directed      Call MD for:  severe uncontrolled pain    Complete by:  As directed      Call MD for:  temperature >100.4    Complete by:  As directed      Diet general    Complete by:  As directed      Discharge wound care:    Complete by:  As directed   Dry dressing over wound until Friday     Driving Restrictions    Complete by:  As directed   Do not drive while taking pain medications     Increase activity slowly    Complete by:   As directed      May shower / Bathe    Complete by:  As directed      May walk up steps    Complete by:  As directed      Remove dressing in 24 hours    Complete by:  As directed             Medication List         aspirin 325 MG tablet  Take 325 mg by mouth daily.     atorvastatin 80 MG tablet  Commonly known as:  LIPITOR  Take 80 mg by mouth at bedtime.     b complex vitamins capsule  Take 1 capsule by mouth daily.     docusate sodium 100 MG capsule  Commonly known as:  COLACE  Take 100 mg by mouth daily.     famotidine 20 MG tablet  Commonly known as:  PEPCID  Take 20 mg by mouth 2 (two) times daily.     indomethacin 75 MG CR capsule  Commonly known as:  INDOCIN SR  Take 75 mg by mouth daily as needed for mild pain.     metFORMIN 500 MG tablet  Commonly known as:  GLUCOPHAGE  Take by mouth daily with breakfast.     metoprolol succinate 100 MG 24 hr tablet  Commonly  known as:  TOPROL-XL  Take 100 mg by mouth at bedtime. Take with or immediately following a meal.     multivitamin tablet  Take 1 tablet by mouth daily.     nitroGLYCERIN 0.4 MG SL tablet  Commonly known as:  NITROSTAT  Place 1 tablet (0.4 mg total) under the tongue every 5 (five) minutes as needed for chest pain.     omega-3 acid ethyl esters 1 G capsule  Commonly known as:  LOVAZA  Take 4 capsules (4 g total) by mouth daily.     oxyCODONE-acetaminophen 5-325 MG per tablet  Commonly known as:  PERCOCET/ROXICET  Take 1-2 tablets by mouth every 4 (four) hours as needed for moderate pain.     ramipril 2.5 MG capsule  Commonly known as:  ALTACE  Take 1 capsule (2.5 mg total) by mouth daily.     Vitamin D3 3000 UNITS Tabs  Take 3,000 Units by mouth.           Follow-up Information   Follow up with Maia Petties., MD. Schedule an appointment as soon as possible for a visit in 2 weeks.   Specialty:  General Surgery   Contact information:   [address] [place]  [address] [phone]       Signed: Maia Petties. 09/01/2014, 7:47 AM

## 2014-09-02 ENCOUNTER — Encounter (HOSPITAL_COMMUNITY): Payer: Self-pay | Admitting: Surgery

## 2014-09-27 ENCOUNTER — Other Ambulatory Visit: Payer: Self-pay | Admitting: Cardiology

## 2014-12-17 ENCOUNTER — Other Ambulatory Visit: Payer: Self-pay | Admitting: Cardiology

## 2015-01-17 ENCOUNTER — Other Ambulatory Visit: Payer: Self-pay | Admitting: Cardiology

## 2015-03-17 ENCOUNTER — Other Ambulatory Visit: Payer: Self-pay | Admitting: Cardiology

## 2015-03-23 ENCOUNTER — Encounter: Payer: Self-pay | Admitting: Cardiology

## 2015-03-23 ENCOUNTER — Ambulatory Visit (INDEPENDENT_AMBULATORY_CARE_PROVIDER_SITE_OTHER): Payer: BLUE CROSS/BLUE SHIELD | Admitting: Cardiology

## 2015-03-23 VITALS — BP 110/74 | HR 50 | Ht 75.0 in | Wt 245.1 lb

## 2015-03-23 DIAGNOSIS — E785 Hyperlipidemia, unspecified: Secondary | ICD-10-CM | POA: Diagnosis not present

## 2015-03-23 DIAGNOSIS — I10 Essential (primary) hypertension: Secondary | ICD-10-CM

## 2015-03-23 DIAGNOSIS — I2510 Atherosclerotic heart disease of native coronary artery without angina pectoris: Secondary | ICD-10-CM

## 2015-03-23 DIAGNOSIS — I2583 Coronary atherosclerosis due to lipid rich plaque: Principal | ICD-10-CM

## 2015-03-23 NOTE — Patient Instructions (Signed)
Continue your current therapy  I will see you in one year   

## 2015-03-23 NOTE — Progress Notes (Signed)
Peter Gray. Date of Birth: 1956/06/12 Medical Record #[id number]  History of Present Illness: Peter Gray is seen back today for follow up CAD. He has known CAD with lateral MI in 2008 and emergent stenting of the LCX with BMS. Normal nuclear study in March 2014. Other issues are HLD, DM,  and OSA. On his last visit labs revealed poorly controlled DM with A1c over 9. He re-established care with Dr. Reynaldo Minium and was started on metformin. Since then A1c has returned to normal and he has lost 19 lbs. He is feeling much better. No chest pain or dyspnea. Does note some fatigue. Knows he needs to exercise more.   Current Outpatient Prescriptions on File Prior to Visit  Medication Sig Dispense Refill  . aspirin 325 MG tablet Take 325 mg by mouth daily.    Marland Kitchen atorvastatin (LIPITOR) 80 MG tablet Take 80 mg by mouth at bedtime.    Marland Kitchen b complex vitamins capsule Take 1 capsule by mouth daily.    . Cholecalciferol (VITAMIN D3) 3000 UNITS TABS Take 3,000 Units by mouth.    . docusate sodium (COLACE) 100 MG capsule Take 100 mg by mouth daily.    . famotidine (PEPCID) 20 MG tablet Take 20 mg by mouth 2 (two) times daily.    . indomethacin (INDOCIN SR) 75 MG CR capsule Take 75 mg by mouth daily as needed for mild pain.     . metFORMIN (GLUCOPHAGE) 500 MG tablet Take by mouth daily with breakfast.    . metoprolol succinate (TOPROL-XL) 100 MG 24 hr tablet TAKE 1 TABLET BY MOUTH EVERY DAY FOLLOWING A MEAL 30 tablet 0  . Multiple Vitamin (MULTIVITAMIN) tablet Take 1 tablet by mouth daily.    . nitroGLYCERIN (NITROSTAT) 0.4 MG SL tablet Place 1 tablet (0.4 mg total) under the tongue every 5 (five) minutes as needed for chest pain. 25 tablet 3  . omega-3 acid ethyl esters (LOVAZA) 1 G capsule Take 4 capsules (4 g total) by mouth daily. 120 capsule 10  . ramipril (ALTACE) 2.5 MG capsule Take 1 capsule (2.5 mg total) by mouth daily. 90 capsule 3   No current facility-administered medications on file prior to visit.     Allergies  Allergen Reactions  . Bee Venom Swelling    Past Medical History  Diagnosis Date  . Hyperlipidemia   . Obesity   . Hypertension   . Coronary artery disease   . Gout   . Myocardial infarction 2008  . Diabetes mellitus without complication     Hgb N8G- has lately reduced to 5.6, per management by Dr. Reynaldo Minium, has lost 30 +lbs.  . Sleep apnea     uses CPAP- last study- long ago  . GERD (gastroesophageal reflux disease)   . Neuromuscular disorder     funny sensations in both feet on & off, nerves chronically traumatized with problemed feet    . Hepatitis   . Arthritis     joints- in general   . Encounter for blood transfusion     as a newborn, on two occasions had bld. transfusion    Past Surgical History  Procedure Laterality Date  . Coronary angioplasty with stent placement    . Elbow surgery Right 1984    bone spur removed   . Vasectomy    . Joint replacement Left 2010    hip  . Umbilical hernia repair  08/31/2014    WITH [facility]         DR  TSUEI  . Umbilical hernia repair N/A 08/31/2014    Procedure: HERNIA REPAIR UMBILICAL ADULT;  Surgeon: Donnie Mesa, MD;  Location: [facility];  Service: General;  Laterality: N/A;  . Insertion of mesh N/A 08/31/2014    Procedure: INSERTION OF MESH;  Surgeon: Donnie Mesa, MD;  Location: [facility];  Service: General;  Laterality: N/A;    History  Smoking status  . Never Smoker   Smokeless tobacco  . Never Used    History  Alcohol Use  . Yes    Comment: occas,- less than social     Family History  Problem Relation Age of Onset  . Coronary artery disease Mother   . Breast cancer Mother   . Other Father   . Coronary artery disease Father   . Heart failure Father     Review of Systems: The review of systems is per the HPI.  All other systems were reviewed and are negative.  Physical Exam: BP 110/74 mmHg  Pulse 50  Ht 6\' 3"  (1.905 m)  Wt 245 lb 1.6 oz (111.177 kg)  BMI 30.64 kg/m2 Patient is  pleasant and  in no acute distress. Skin is warm and dry. Color is normal.  HEENT is unremarkable. Normocephalic/atraumatic. PERRL. Sclera are nonicteric. Neck is supple. No masses. No JVD. Lungs are clear. Cardiac exam shows a regular rate and rhythm. Abdomen is soft. Extremities are without edema. Gait and ROM are intact. No gross neurologic deficits noted.   LABORATORY DATA: Lab Results  Component Value Date   WBC 6.7 08/22/2014   HGB 13.3 08/22/2014   HCT 38.2* 08/22/2014   PLT 183 08/22/2014   GLUCOSE 96 08/31/2014   CHOL 150 01/28/2014   TRIG 149.0 01/28/2014   HDL 37.40* 01/28/2014   LDLCALC 83 01/28/2014   ALT 29 01/28/2014   AST 28 01/28/2014   NA 142 08/31/2014   K 4.9 08/31/2014   CL 102 08/31/2014   CREATININE 1.13 08/31/2014   BUN 21 08/31/2014   CO2 28 08/31/2014   PSA 0.75 01/28/2014   INR 1.17 09/06/2009   HGBA1C 9.4* 01/28/2014   Ecg: today-NSR rate 50, normal. I have personally reviewed and interpreted this study.   Assessment / Plan: 1. CAD - no symptoms reported. Continue to work on risk factor modification.  Medicines are refilled today. I will follow up in one year.  2. HLD - lipids acceptable. Continue high dose statin.   3. Obesity -  encouraged life style changes and continued weight loss..   4. Diabetes mellitus. Improved control on metformin. Continue follow up with Dr. Reynaldo Minium.

## 2015-04-12 ENCOUNTER — Other Ambulatory Visit: Payer: Self-pay | Admitting: Cardiology

## 2015-04-12 NOTE — Telephone Encounter (Signed)
Rx(s) sent to pharmacy electronically.  

## 2015-07-24 ENCOUNTER — Other Ambulatory Visit: Payer: Self-pay | Admitting: Cardiology

## 2015-08-17 ENCOUNTER — Encounter: Payer: Self-pay | Admitting: Internal Medicine

## 2015-08-17 ENCOUNTER — Ambulatory Visit (INDEPENDENT_AMBULATORY_CARE_PROVIDER_SITE_OTHER): Payer: BLUE CROSS/BLUE SHIELD | Admitting: Internal Medicine

## 2015-08-17 VITALS — BP 126/70 | HR 57 | Ht 73.0 in | Wt 256.8 lb

## 2015-08-17 DIAGNOSIS — G4733 Obstructive sleep apnea (adult) (pediatric): Secondary | ICD-10-CM | POA: Diagnosis not present

## 2015-08-17 DIAGNOSIS — I10 Essential (primary) hypertension: Secondary | ICD-10-CM | POA: Diagnosis not present

## 2015-08-17 NOTE — Patient Instructions (Addendum)
Order- [facility]-  Replacement for old CPAP machine, auto 8-15, mask of choice, humidifier, supplies, airview                         Dx OSA

## 2015-08-17 NOTE — Assessment & Plan Note (Signed)
Discussed interaction of untreated obstructive sleep apnea with hypertension and renal insufficiency

## 2015-08-17 NOTE — Assessment & Plan Note (Addendum)
CPAP is his best treatment option and he needs to use it. We discussed most recent reports of CPAP impact on blood pressure and renal function as well as uncertainties about impact on coronary disease. We think he has been using pressure at 10. Compliance has been good although he is not entirely satisfied with his mask. We discussed comfort issues and options, compared CPAP with oral appliances, basic sleep hygiene Plan-replace old CPAP machine, initially using auto 8-15 until we can get a download. Mask of choice.

## 2015-08-17 NOTE — Progress Notes (Signed)
08/17/15- 59 yoM never smoker being seen for management of obstructive sleep apnea, complicated by CAD/MI/stent, DM 2 NPSG 05/04/07-   AHI 14.6/ hr, CPAP titrated to 10,   Weight 235 lbs      Referred by Dr Reynaldo Minium. Pt states having sleep study 12 years ago that stated mild OSA. Pt sleeps with CPAP every night for at least 6 hours a night. DME [facility].   Former patient, last seen in 2011. He is still using old machine with nasal mask. Recognizing some daytime sleepiness. Bedtime between midnight and 2 AM, waking 2 or 3 times before up between 8 and 10 AM. Weight is up about 20 pounds from recorded weight at time of sleep study. He tolerates mask and admits he is better off when he uses CPAP-no snoring, less daytime sleepiness. He tried a boil and bite mouthpiece on his own but disliked it. Nasal pillows mask hurt his nose.  Prior to Admission medications   Medication Sig Start Date End Date Taking? Authorizing Provider  aspirin 325 MG tablet Take 325 mg by mouth daily.   Yes Historical Provider, MD  atorvastatin (LIPITOR) 80 MG tablet Take 80 mg by mouth at bedtime. 08/22/14  Yes Peter M Martinique, MD  b complex vitamins capsule Take 1 capsule by mouth daily.   Yes Historical Provider, MD  Cholecalciferol (VITAMIN D3) 3000 UNITS TABS Take 3,000 Units by mouth.   Yes Historical Provider, MD  docusate sodium (COLACE) 100 MG capsule Take 100 mg by mouth daily.   Yes Historical Provider, MD  famotidine (PEPCID) 20 MG tablet Take 20 mg by mouth 2 (two) times daily.   Yes Historical Provider, MD  indomethacin (INDOCIN SR) 75 MG CR capsule Take 75 mg by mouth daily as needed for mild pain.  11/25/11  Yes Historical Provider, MD  metFORMIN (GLUCOPHAGE) 500 MG tablet Take by mouth daily with breakfast.   Yes Historical Provider, MD  metoprolol succinate (TOPROL-XL) 100 MG 24 hr tablet TAKE 1 TABLET BY MOUTH EVERY DAY FOLLOWING A MEAL 04/12/15  Yes Peter M Martinique, MD  Multiple Vitamin (MULTIVITAMIN) tablet Take 1 tablet  by mouth daily.   Yes Historical Provider, MD  nitroGLYCERIN (NITROSTAT) 0.4 MG SL tablet Place 1 tablet (0.4 mg total) under the tongue every 5 (five) minutes as needed for chest pain. 09/11/11  Yes Peter M Martinique, MD  omega-3 acid ethyl esters (LOVAZA) 1 G capsule TAKE 4 CAPSULES BY MOUTH DAILY 07/25/15  Yes Peter M Martinique, MD  ramipril (ALTACE) 2.5 MG capsule Take 1 capsule (2.5 mg total) by mouth daily. 08/22/14  Yes Peter M Martinique, MD   Past Medical History  Diagnosis Date  . Hyperlipidemia   . Obesity   . Hypertension   . Coronary artery disease   . Gout   . Myocardial infarction ([facility]) 2008  . Diabetes mellitus without complication (HCC)     Hgb A1c- has lately reduced to 5.6, per management by Dr. Reynaldo Minium, has lost 30 +lbs.  . Sleep apnea     uses CPAP- last study- long ago  . GERD (gastroesophageal reflux disease)   . Neuromuscular disorder ([facility])     funny sensations in both feet on & off, nerves chronically traumatized with problemed feet    . Hepatitis   . Arthritis     joints- in general   . Encounter for blood transfusion     as a newborn, on two occasions had bld. transfusion   Past Surgical History  Procedure Laterality Date  . Coronary angioplasty with stent placement    . Elbow surgery Right 1984    bone spur removed   . Vasectomy    . Joint replacement Left 2010    hip  . Umbilical hernia repair  08/31/2014    WITH [facility]         DR TSUEI  . Umbilical hernia repair N/A 08/31/2014    Procedure: HERNIA REPAIR UMBILICAL ADULT;  Surgeon: Donnie Mesa, MD;  Location: [facility];  Service: General;  Laterality: N/A;  . Insertion of mesh N/A 08/31/2014    Procedure: INSERTION OF MESH;  Surgeon: Donnie Mesa, MD;  Location: [facility];  Service: General;  Laterality: N/A;   Family History  Problem Relation Age of Onset  . Coronary artery disease Mother   . Breast cancer Mother   . Other Father   . Coronary artery disease Father   . Heart failure Father    Social History    Social History  . Marital Status: Married    Spouse Name: N/A  . Number of Children: N/A  . Years of Education: N/A   Occupational History  . Not on file.   Social History Main Topics  . Smoking status: Never Smoker   . Smokeless tobacco: Never Used  . Alcohol Use: Yes     Comment: occas,- less than social   . Drug Use: No  . Sexual Activity: Yes    Birth Control/ Protection: Surgical   Other Topics Concern  . Not on file   Social History Narrative   ROS-see HPI   Negative unless "+" Constitutional:    weight loss, night sweats, fevers, chills, + fatigue, lassitude. HEENT:    headaches, difficulty swallowing, tooth/dental problems, sore throat,       sneezing, itching, ear ache, nasal congestion, post nasal drip, snoring CV:    chest pain, orthopnea, PND, swelling in lower extremities, anasarca,                                                dizziness, palpitations Resp:   shortness of breath with exertion or at rest.                productive cough,   non-productive cough, coughing up of blood.              change in color of mucus.  wheezing.   Skin:    rash or lesions. GI:  No-   heartburn, indigestion, abdominal pain, nausea, vomiting, diarrhea,                 change in bowel habits, loss of appetite GU: dysuria, change in color of urine, no urgency or frequency.   flank pain. MS:  + joint pain, stiffness, decreased range of motion, back pain. Neuro-     nothing unusual Psych:  change in mood or affect.  depression or anxiety.   memory loss.  OBJ- Physical Exam General- Alert, Oriented, Affect-appropriate, Distress- none acute, + overweight Skin- rash-none, lesions- none, excoriation- none Lymphadenopathy- none Head- atraumatic            Eyes- Gross vision intact, PERRLA, conjunctivae and secretions clear            Ears- Hearing, canals-normal            Nose- Clear, no-Septal  dev, mucus, polyps, erosion, perforation             Throat- Mallampati III ,  mucosa clear , drainage- none, tonsils- atrophic Neck- flexible , trachea midline, no stridor , thyroid nl, carotid no bruit Chest - symmetrical excursion , unlabored           Heart/CV- RRR , no murmur , no gallop  , no rub, nl s1 s2                           - JVD- none , edema- none, stasis changes- none, varices- none           Lung- clear to P&A, wheeze- none, cough- none , dullness-none, rub- none           Chest wall-  Abd-  Br/ Gen/ Rectal- Not done, not indicated Extrem- cyanosis- none, clubbing, none, atrophy- none, strength- nl Neuro- grossly intact to observation

## 2015-09-24 ENCOUNTER — Other Ambulatory Visit: Payer: Self-pay | Admitting: Cardiovascular Disease

## 2015-10-03 ENCOUNTER — Encounter: Payer: Self-pay | Admitting: Internal Medicine

## 2015-10-11 ENCOUNTER — Other Ambulatory Visit: Payer: Self-pay | Admitting: Cardiology

## 2015-10-12 NOTE — Telephone Encounter (Signed)
REFILL 

## 2015-11-08 ENCOUNTER — Other Ambulatory Visit: Payer: Self-pay | Admitting: Cardiology

## 2015-11-08 MED ORDER — METOPROLOL SUCCINATE ER 100 MG PO TB24: 100.0000 mg | ORAL_TABLET | Freq: Every day | ORAL | Status: DC

## 2016-03-22 DIAGNOSIS — R05 Cough: Secondary | ICD-10-CM | POA: Diagnosis not present

## 2016-03-22 DIAGNOSIS — I129 Hypertensive chronic kidney disease with stage 1 through stage 4 chronic kidney disease, or unspecified chronic kidney disease: Secondary | ICD-10-CM | POA: Diagnosis not present

## 2016-03-22 DIAGNOSIS — Z1389 Encounter for screening for other disorder: Secondary | ICD-10-CM | POA: Diagnosis not present

## 2016-03-22 DIAGNOSIS — Z6833 Body mass index (BMI) 33.0-33.9, adult: Secondary | ICD-10-CM | POA: Diagnosis not present

## 2016-03-22 DIAGNOSIS — E1129 Type 2 diabetes mellitus with other diabetic kidney complication: Secondary | ICD-10-CM | POA: Diagnosis not present

## 2016-03-22 DIAGNOSIS — I10 Essential (primary) hypertension: Secondary | ICD-10-CM | POA: Diagnosis not present

## 2016-03-22 DIAGNOSIS — E291 Testicular hypofunction: Secondary | ICD-10-CM | POA: Diagnosis not present

## 2016-03-22 DIAGNOSIS — E784 Other hyperlipidemia: Secondary | ICD-10-CM | POA: Diagnosis not present

## 2016-03-31 ENCOUNTER — Other Ambulatory Visit: Payer: Self-pay | Admitting: Cardiovascular Disease

## 2016-04-02 NOTE — Telephone Encounter (Signed)
Rx(s) sent to pharmacy electronically.  

## 2016-04-13 ENCOUNTER — Encounter (HOSPITAL_COMMUNITY): Payer: Self-pay | Admitting: Emergency Medicine

## 2016-04-13 ENCOUNTER — Emergency Department (HOSPITAL_COMMUNITY)
Admission: EM | Admit: 2016-04-13 | Discharge: 2016-04-13 | Disposition: A | Discharge status: Home / Self Care | Payer: BLUE CROSS/BLUE SHIELD | Attending: Emergency Medicine | Admitting: Emergency Medicine

## 2016-04-13 ENCOUNTER — Emergency Department (HOSPITAL_COMMUNITY): Payer: BLUE CROSS/BLUE SHIELD

## 2016-04-13 DIAGNOSIS — Y999 Unspecified external cause status: Secondary | ICD-10-CM | POA: Insufficient documentation

## 2016-04-13 DIAGNOSIS — Z955 Presence of coronary angioplasty implant and graft: Secondary | ICD-10-CM | POA: Insufficient documentation

## 2016-04-13 DIAGNOSIS — Z7984 Long term (current) use of oral hypoglycemic drugs: Secondary | ICD-10-CM | POA: Diagnosis not present

## 2016-04-13 DIAGNOSIS — Z96642 Presence of left artificial hip joint: Secondary | ICD-10-CM | POA: Insufficient documentation

## 2016-04-13 DIAGNOSIS — I252 Old myocardial infarction: Secondary | ICD-10-CM | POA: Diagnosis not present

## 2016-04-13 DIAGNOSIS — S81811A Laceration without foreign body, right lower leg, initial encounter: Secondary | ICD-10-CM | POA: Diagnosis not present

## 2016-04-13 DIAGNOSIS — W268XXA Contact with other sharp object(s), not elsewhere classified, initial encounter: Secondary | ICD-10-CM | POA: Insufficient documentation

## 2016-04-13 DIAGNOSIS — Y929 Unspecified place or not applicable: Secondary | ICD-10-CM | POA: Diagnosis not present

## 2016-04-13 DIAGNOSIS — Z79899 Other long term (current) drug therapy: Secondary | ICD-10-CM | POA: Diagnosis not present

## 2016-04-13 DIAGNOSIS — E785 Hyperlipidemia, unspecified: Secondary | ICD-10-CM | POA: Insufficient documentation

## 2016-04-13 DIAGNOSIS — I2510 Atherosclerotic heart disease of native coronary artery without angina pectoris: Secondary | ICD-10-CM | POA: Diagnosis not present

## 2016-04-13 DIAGNOSIS — Y939 Activity, unspecified: Secondary | ICD-10-CM | POA: Insufficient documentation

## 2016-04-13 DIAGNOSIS — E119 Type 2 diabetes mellitus without complications: Secondary | ICD-10-CM | POA: Insufficient documentation

## 2016-04-13 DIAGNOSIS — I10 Essential (primary) hypertension: Secondary | ICD-10-CM | POA: Insufficient documentation

## 2016-04-13 DIAGNOSIS — Z23 Encounter for immunization: Secondary | ICD-10-CM | POA: Insufficient documentation

## 2016-04-13 DIAGNOSIS — Z7982 Long term (current) use of aspirin: Secondary | ICD-10-CM | POA: Diagnosis not present

## 2016-04-13 MED ORDER — LIDOCAINE-EPINEPHRINE 1 %-1:100000 IJ SOLN
20.0000 mL | Freq: Once | INTRAMUSCULAR | Status: DC
  Filled 2016-04-13: qty 20

## 2016-04-13 MED ORDER — LIDOCAINE-EPINEPHRINE 2 %-1:100000 IJ SOLN: 20.0000 mL | Freq: Once | INTRAMUSCULAR | Status: DC

## 2016-04-13 MED ORDER — TETANUS-DIPHTH-ACELL PERTUSSIS 5-2.5-18.5 LF-MCG/0.5 IM SUSP
0.5000 mL | Freq: Once | INTRAMUSCULAR | Status: AC
  Administered 2016-04-13: 0.5 mL via INTRAMUSCULAR
  Filled 2016-04-13: qty 0.5

## 2016-04-13 MED ORDER — LIDOCAINE-EPINEPHRINE 1 %-1:100000 IJ SOLN
INTRAMUSCULAR | Status: AC
  Filled 2016-04-13: qty 1

## 2016-04-13 MED ORDER — CEPHALEXIN 500 MG PO CAPS: 500.0000 mg | ORAL_CAPSULE | Freq: Three times a day (TID) | ORAL | Status: DC

## 2016-04-13 NOTE — ED Provider Notes (Signed)
CSN: [id number]     Arrival date & time 04/13/16  1740 History  By signing my name below, I, Peter Gray, attest that this documentation has been prepared under the direction and in the presence of Peter Gray, Peter Gray, . Electronically Signed: Stephania Gray, ED Scribe. 04/13/2016. 8:18 PM.    Chief Complaint  Patient presents with  . Extremity Laceration   The history is provided by the patient and medical records. No language interpreter was used.    HPI Comments: Peter Gray. is a 60 y.o. male with a history of well-controlled diabetes, who presents to the Emergency Department complaining of a laceration on his right calf that onset PTA, about 2 hours ago. Patient states he was emptying the contents of a refrigerator into a trash bag when he accidentally scraped his calf against a sharp edge in the bag. The bag was full of animal feces, bathroom trash, and broken glass.  He states he did not experience any pain but noticed his leg was bleeding when he looked down. He denies any pain presently but states the laceration has been continuing to bleed slowly. He denies being on any anticoagulation -does take NSAIDs and omega 3s. He states walking seems to exacerbate his bleeding. Per nursing notes, patient states he cannot remember the date of his last tetanus vaccination.  Past Medical History  Diagnosis Date  . Hyperlipidemia   . Obesity   . Hypertension   . Coronary artery disease   . Gout   . Myocardial infarction ([place]) 2008  . Diabetes mellitus without complication (HCC)     Hgb A1c- has lately reduced to 5.6, per management by Peter Gray, has lost 30 +lbs.  . Sleep apnea     uses CPAP- last study- long ago  . GERD (gastroesophageal reflux disease)   . Neuromuscular disorder ([place])     funny sensations in both feet on & off, nerves chronically traumatized with problemed feet    . Hepatitis   . Arthritis     joints- in general   . Encounter for blood transfusion     as a newborn, on two  occasions had bld. transfusion   Past Surgical History  Procedure Laterality Date  . Coronary angioplasty with stent placement    . Elbow surgery Right 1984    bone spur removed   . Vasectomy    . Joint replacement Left 2010    hip  . Umbilical hernia repair  08/31/2014    WITH Peter Gray         DR Peter Gray  . Umbilical hernia repair N/A 08/31/2014    Procedure: HERNIA REPAIR UMBILICAL ADULT;  Surgeon: Peter Mesa, MD;  Location: [facility];  Service: General;  Laterality: N/A;  . Insertion of mesh N/A 08/31/2014    Procedure: INSERTION OF MESH;  Surgeon: Peter Mesa, MD;  Location: [facility];  Service: General;  Laterality: N/A;   Family History  Problem Relation Age of Onset  . Coronary artery disease Mother   . Breast cancer Mother   . Other Father   . Coronary artery disease Father   . Heart failure Father    Social History  Substance Use Topics  . Smoking status: Never Smoker   . Smokeless tobacco: Never Used  . Alcohol Use: Yes     Comment: occas,- less than social     Review of Systems  Constitutional: Negative for activity change.  Cardiovascular: Negative for leg swelling.  Musculoskeletal: Negative for myalgias.  Skin: Positive for wound. Negative for color change.  Neurological: Negative for weakness and numbness.  Hematological: Does not bruise/bleed easily.  Psychiatric/Behavioral: Negative for self-injury (accidental ).   Allergies  Bee venom  Home Medications   Prior to Admission medications   Medication Sig Start Date End Date Taking? Authorizing Provider  aspirin 325 MG tablet Take 325 mg by mouth daily.    Historical Provider, MD  atorvastatin (LIPITOR) 80 MG tablet TAKE 1 TABLET BY MOUTH DAILY 04/02/16   Peter M Martinique, MD  b complex vitamins capsule Take 1 capsule by mouth daily.    Historical Provider, MD  Cholecalciferol (VITAMIN D3) 3000 UNITS TABS Take 3,000 Units by mouth.    Historical Provider, MD  docusate sodium (COLACE) 100 MG capsule Take 100 mg  by mouth daily.    Historical Provider, MD  famotidine (PEPCID) 20 MG tablet Take 20 mg by mouth 2 (two) times daily.    Historical Provider, MD  indomethacin (INDOCIN SR) 75 MG CR capsule Take 75 mg by mouth daily as needed for mild pain.  11/25/11   Historical Provider, MD  metFORMIN (GLUCOPHAGE) 500 MG tablet Take by mouth daily with breakfast.    Historical Provider, MD  metoprolol succinate (TOPROL-XL) 100 MG 24 hr tablet Take 1 tablet (100 mg total) by mouth daily. Take with or immediately following a meal. 11/08/15   Peter M Martinique, MD  Multiple Vitamin (MULTIVITAMIN) tablet Take 1 tablet by mouth daily.    Historical Provider, MD  nitroGLYCERIN (NITROSTAT) 0.4 MG SL tablet Place 1 tablet (0.4 mg total) under the tongue every 5 (five) minutes as needed for chest pain. 09/11/11   Peter M Martinique, MD  omega-3 acid ethyl esters (LOVAZA) 1 G capsule TAKE 4 CAPSULES BY MOUTH DAILY 07/25/15   Peter M Martinique, MD  ramipril (ALTACE) 2.5 MG capsule TAKE ONE CAPSULE BY MOUTH DAILY 10/12/15   Peter M Martinique, MD   There were no vitals taken for this visit. Physical Exam  Constitutional: He appears well-developed and well-nourished. No distress.  HENT:  Head: Normocephalic and atraumatic.  Eyes: Conjunctivae are normal.  Neck: Neck supple.  Pulmonary/Chest: Effort normal.  Musculoskeletal:  Normal ROM of right knee and ankle.    Neurological: He is alert. He exhibits normal muscle tone.  Gait is normal  Skin: He is not diaphoretic.  Laceration across the right lateral calf that is approximately 6 cm, slowly bleeding. Calf is soft, non-tender.   Psychiatric: He has a normal mood and affect. His behavior is normal.  Nursing note and vitals reviewed.   ED Course  Procedures (including critical care time)  COORDINATION OF CARE: 6:50 PM - Discussed treatment plan with pt at bedside which includes laceration repair. Still awaiting XR results. Pt verbalized understanding and agreed to plan.   7:12 PM  - Pt made aware of negative XR results.  Imaging Review Dg Tibia/fibula Right  04/13/2016  CLINICAL DATA:  Laceration at the right posterior and lateral lower leg. Initial encounter. EXAM: RIGHT TIBIA AND FIBULA - 2 VIEW COMPARISON:  None. FINDINGS: The known soft tissue laceration is partially characterized. No radiopaque foreign bodies are seen. The tibia and fibula are grossly unremarkable in appearance. The ankle mortise is incompletely assessed, but appears grossly unremarkable. The knee joint is incompletely assessed. A small accessory ossicle is noted at the inferior pole of the patella. Scattered soft tissue calcifications are seen. IMPRESSION: No radiopaque foreign bodies seen. No evidence of fracture or dislocation.  Electronically Signed   By: Garald Balding M.D.   On: 04/13/2016 19:08   I have personally reviewed and evaluated these images as part of my medical decision-making.   LACERATION REPAIR PROCEDURE NOTE The patient's identification was confirmed and consent was obtained. This procedure was performed by Clayton Bibles, PA-C, at 8:19 PM. Site: Right lateral calf Sterile procedures observed Anesthetic used (type and amt): lidocaine 1%, 8 mL Suture type/size: 4-0 ethilon Length: 6 cm # of Sutures: 10 Technique: simple interrupted Complexity Antibx ointment applied Tetanus ordered Site anesthetized, irrigated with NS, explored without evidence of foreign body, wound well approximated, site covered with dry, sterile dressing.  Patient tolerated procedure well without complications. Instructions for care discussed verbally and patient provided with additional written instructions for homecare and f/u.   MDM   Final diagnoses:  Laceration of lower extremity, right, initial encounter   Afebrile nontoxic patient with superficial laceration to right calf.  Tdap booster given.Pressure irrigation performed. X-ray (DG tibia/fibula right) was negative for radiopaque foreign bodies,  fracture, or dislocation. Laceration occurred < 8 hours prior to repair which was well tolerated. Discussed suture home care w pt and answered questions. Pt to f-u for wound check and suture removal in 14 days. Pt is hemodynamically stable w no complaints prior to dc.   Discussed result, findings, treatment, and follow up  with patient.  Pt given return precautions.  Pt verbalizes understanding and agrees with plan.        I personally performed the services described in this documentation, which was scribed in my presence. The recorded information has been reviewed and is accurate.    Clayton Bibles, PA-C 04/13/16 2142  Lacretia Leigh, MD 04/14/16 2249

## 2016-04-13 NOTE — Discharge Instructions (Signed)
Read the information below.  Use the prescribed medication as directed.  Please discuss all new medications with your pharmacist.  You may return to the Emergency Department at any time for worsening condition or any new symptoms that concern you.    If you develop redness, swelling, pus draining from the wound, or fevers greater than 100.4, return to the ER immediately for a recheck.     Laceration Care, Adult A laceration is a cut that goes through all of the layers of the skin and into the tissue that is right under the skin. Some lacerations heal on their own. Others need to be closed with stitches (sutures), staples, skin adhesive strips, or skin glue. Proper laceration care minimizes the risk of infection and helps the laceration to heal better. HOW TO CARE FOR YOUR LACERATION If sutures or staples were used:  Keep the wound clean and dry.  If you were given a bandage (dressing), you should change it at least one time per day or as told by your health care provider. You should also change it if it becomes wet or dirty.  Keep the wound completely dry for the first 24 hours or as told by your health care provider. After that time, you may shower or bathe. However, make sure that the wound is not soaked in water until after the sutures or staples have been removed.  Clean the wound one time each day or as told by your health care provider:  Wash the wound with soap and water.  Rinse the wound with water to remove all soap.  Pat the wound dry with a clean towel. Do not rub the wound.  After cleaning the wound, apply a thin layer of antibiotic ointmentas told by your health care provider. This will help to prevent infection and keep the dressing from sticking to the wound.  Have the sutures or staples removed as told by your health care provider. If skin adhesive strips were used:  Keep the wound clean and dry.  If you were given a bandage (dressing), you should change it at least one  time per day or as told by your health care provider. You should also change it if it becomes dirty or wet.  Do not get the skin adhesive strips wet. You may shower or bathe, but be careful to keep the wound dry.  If the wound gets wet, pat it dry with a clean towel. Do not rub the wound.  Skin adhesive strips fall off on their own. You may trim the strips as the wound heals. Do not remove skin adhesive strips that are still stuck to the wound. They will fall off in time. If skin glue was used:  Try to keep the wound dry, but you may briefly wet it in the shower or bath. Do not soak the wound in water, such as by swimming.  After you have showered or bathed, gently pat the wound dry with a clean towel. Do not rub the wound.  Do not do any activities that will make you sweat heavily until the skin glue has fallen off on its own.  Do not apply liquid, cream, or ointment medicine to the wound while the skin glue is in place. Using those may loosen the film before the wound has healed.  If you were given a bandage (dressing), you should change it at least one time per day or as told by your health care provider. You should also change it if  it becomes dirty or wet.  If a dressing is placed over the wound, be careful not to apply tape directly over the skin glue. Doing that may cause the glue to be pulled off before the wound has healed.  Do not pick at the glue. The skin glue usually remains in place for 5-10 days, then it falls off of the skin. General Instructions  Take over-the-counter and prescription medicines only as told by your health care provider.  If you were prescribed an antibiotic medicine or ointment, take or apply it as told by your doctor. Do not stop using it even if your condition improves.  To help prevent scarring, make sure to cover your wound with sunscreen whenever you are outside after stitches are removed, after adhesive strips are removed, or when glue remains in  place and the wound is healed. Make sure to wear a sunscreen of at least 30 SPF.  Do not scratch or pick at the wound.  Keep all follow-up visits as told by your health care provider. This is important.  Check your wound every day for signs of infection. Watch for:  Redness, swelling, or pain.  Fluid, blood, or pus.  Raise (elevate) the injured area above the level of your heart while you are sitting or lying down, if possible. SEEK MEDICAL CARE IF:  You received a tetanus shot and you have swelling, severe pain, redness, or bleeding at the injection site.  You have a fever.  A wound that was closed breaks open.  You notice a bad smell coming from your wound or your dressing.  You notice something coming out of the wound, such as wood or glass.  Your pain is not controlled with medicine.  You have increased redness, swelling, or pain at the site of your wound.  You have fluid, blood, or pus coming from your wound.  You notice a change in the color of your skin near your wound.  You need to change the dressing frequently due to fluid, blood, or pus draining from the wound.  You develop a new rash.  You develop numbness around the wound. SEEK IMMEDIATE MEDICAL CARE IF:  You develop severe swelling around the wound.  Your pain suddenly increases and is severe.  You develop painful lumps near the wound or on skin that is anywhere on your body.  You have a red streak going away from your wound.  The wound is on your hand or foot and you cannot properly move a finger or toe.  The wound is on your hand or foot and you notice that your fingers or toes look pale or bluish.   This information is not intended to replace advice given to you by your health care provider. Make sure you discuss any questions you have with your health care provider.   Document Released: 10/28/2005 Document Revised: 03/14/2015 Document Reviewed: 10/24/2014 Elsevier Interactive Patient Education  [employer].

## 2016-04-13 NOTE — ED Notes (Signed)
Pt from home with a 2 inch laceration on his right calf. Pt states he was emptying out a refrigerator and he thinks there may have been some broken glass in the bag. Pt states he does take blood thinners, but bleeding is controlled. Pt states he does not remember when his last tetanus shot was.

## 2016-04-22 ENCOUNTER — Other Ambulatory Visit: Payer: Self-pay

## 2016-04-22 MED ORDER — NITROGLYCERIN 0.4 MG SL SUBL: 0.4000 mg | SUBLINGUAL_TABLET | SUBLINGUAL | Status: DC | PRN

## 2016-05-10 ENCOUNTER — Ambulatory Visit (INDEPENDENT_AMBULATORY_CARE_PROVIDER_SITE_OTHER): Payer: BLUE CROSS/BLUE SHIELD | Admitting: Cardiology

## 2016-05-10 ENCOUNTER — Encounter: Payer: Self-pay | Admitting: Cardiology

## 2016-05-10 VITALS — BP 110/66 | HR 45 | Ht 75.0 in | Wt 257.2 lb

## 2016-05-10 DIAGNOSIS — I10 Essential (primary) hypertension: Secondary | ICD-10-CM | POA: Diagnosis not present

## 2016-05-10 DIAGNOSIS — E785 Hyperlipidemia, unspecified: Secondary | ICD-10-CM

## 2016-05-10 DIAGNOSIS — I2510 Atherosclerotic heart disease of native coronary artery without angina pectoris: Secondary | ICD-10-CM

## 2016-05-10 NOTE — Progress Notes (Signed)
[name] Date of Birth: 1955/11/19 Medical Record [id number]  History of Present Illness: Peter Gray is seen back today for follow up CAD. He has known CAD with lateral MI in 2008 and emergent stenting of the LCX with BMS. Normal nuclear study in March 2014. Other issues are HLD, DM,  and OSA. He reports diabetes is under control on metformin.  Weight fluctuates up and down 10 lbs depending on diet and exercise. He is exercising sporadically. He is feeling well.  No chest pain or dyspnea. Recent labs with Dr. Reynaldo Minium.  Current Outpatient Prescriptions on File Prior to Visit  Medication Sig Dispense Refill  . aspirin 325 MG tablet Take 325 mg by mouth daily.    Marland Kitchen atorvastatin (LIPITOR) 80 MG tablet TAKE 1 TABLET BY MOUTH DAILY 90 tablet 0  . b complex vitamins capsule Take 1 capsule by mouth daily.    . cephALEXin (KEFLEX) 500 MG capsule Take 1 capsule (500 mg total) by mouth 3 (three) times daily. To prevent infection 15 capsule 0  . Cholecalciferol (VITAMIN D3) 3000 UNITS TABS Take 3,000 Units by mouth.    . docusate sodium (COLACE) 100 MG capsule Take 100 mg by mouth daily.    . famotidine (PEPCID) 20 MG tablet Take 20 mg by mouth 2 (two) times daily.    . indomethacin (INDOCIN SR) 75 MG CR capsule Take 75 mg by mouth daily as needed for mild pain.     . metFORMIN (GLUCOPHAGE) 500 MG tablet Take by mouth daily with breakfast.    . metoprolol succinate (TOPROL-XL) 100 MG 24 hr tablet Take 1 tablet (100 mg total) by mouth daily. Take with or immediately following a meal. 30 tablet 6  . Multiple Vitamin (MULTIVITAMIN) tablet Take 1 tablet by mouth daily.    . nitroGLYCERIN (NITROSTAT) 0.4 MG SL tablet Place 1 tablet (0.4 mg total) under the tongue every 5 (five) minutes as needed for chest pain. 25 tablet 1  . omega-3 acid ethyl esters (LOVAZA) 1 G capsule TAKE 4 CAPSULES BY MOUTH DAILY 120 capsule 9  . ramipril (ALTACE) 2.5 MG capsule TAKE ONE CAPSULE BY MOUTH DAILY 90 capsule 2   No  current facility-administered medications on file prior to visit.    Allergies  Allergen Reactions  . Bee Venom Swelling    Past Medical History  Diagnosis Date  . Hyperlipidemia   . Obesity   . Hypertension   . Coronary artery disease   . Gout   . Myocardial infarction ([place]) 2008  . Diabetes mellitus without complication (HCC)     Hgb A1c- has lately reduced to 5.6, per management by Dr. Reynaldo Minium, has lost 30 +lbs.  . Sleep apnea     uses CPAP- last study- long ago  . GERD (gastroesophageal reflux disease)   . Neuromuscular disorder ([place])     funny sensations in both feet on & off, nerves chronically traumatized with problemed feet    . Hepatitis   . Arthritis     joints- in general   . Encounter for blood transfusion     as a newborn, on two occasions had bld. transfusion    Past Surgical History  Procedure Laterality Date  . Coronary angioplasty with stent placement    . Elbow surgery Right 1984    bone spur removed   . Vasectomy    . Joint replacement Left 2010    hip  . Umbilical hernia repair  08/31/2014    WITH  [place]         DR TSUEI  . Umbilical hernia repair N/A 08/31/2014    Procedure: HERNIA REPAIR UMBILICAL ADULT;  Surgeon: Donnie Mesa, MD;  Location: [facility];  Service: General;  Laterality: N/A;  . Insertion of mesh N/A 08/31/2014    Procedure: INSERTION OF MESH;  Surgeon: Donnie Mesa, MD;  Location: [facility];  Service: General;  Laterality: N/A;    History  Smoking status  . Never Smoker   Smokeless tobacco  . Never Used    History  Alcohol Use  . Yes    Comment: occas,- less than social     Family History  Problem Relation Age of Onset  . Coronary artery disease Mother   . Breast cancer Mother   . Other Father   . Coronary artery disease Father   . Heart failure Father     Review of Systems: The review of systems is per the HPI.  All other systems were reviewed and are negative.  Physical Exam: There were no vitals taken for this  visit. Patient is  pleasant and in no acute distress. Skin is warm and dry. Color is normal.  HEENT is unremarkable. Normocephalic/atraumatic. PERRL. Sclera are nonicteric. Neck is supple. No masses. No JVD. Lungs are clear. Cardiac exam shows a regular rate and rhythm. Abdomen is soft. Extremities are without edema. Gait and ROM are intact. No gross neurologic deficits noted.   LABORATORY DATA: Lab Results  Component Value Date   WBC 6.7 08/22/2014   HGB 13.3 08/22/2014   HCT 38.2* 08/22/2014   PLT 183 08/22/2014   GLUCOSE 96 08/31/2014   CHOL 150 01/28/2014   TRIG 149.0 01/28/2014   HDL 37.40* 01/28/2014   LDLCALC 83 01/28/2014   ALT 29 01/28/2014   AST 28 01/28/2014   NA 142 08/31/2014   K 4.9 08/31/2014   CL 102 08/31/2014   CREATININE 1.13 08/31/2014   BUN 21 08/31/2014   CO2 28 08/31/2014   PSA 0.75 01/28/2014   INR 1.17 09/06/2009   HGBA1C 9.4* 01/28/2014      Assessment / Plan: 1. CAD - no symptoms reported. Continue to work on risk factor modification.  Will schedule for follow up stress Myoview.   2. HLD -  Continue high dose statin. Request copy of lab work  3. Obesity -  encouraged life style changes and continued weight loss..   4. Diabetes mellitus. Improved control on metformin.  5. OSA on CPAP therapy.

## 2016-05-10 NOTE — Patient Instructions (Signed)
We will get a copy of your lab work from Dr. Reynaldo Minium  We will schedule you for a nuclear stress test

## 2016-05-10 NOTE — Addendum Note (Signed)
Addended by: Kathyrn Lass on: 05/10/2016 09:36 AM   Modules accepted: Orders

## 2016-05-21 ENCOUNTER — Other Ambulatory Visit: Payer: Self-pay

## 2016-05-22 ENCOUNTER — Telehealth (HOSPITAL_COMMUNITY): Payer: Self-pay

## 2016-05-22 MED ORDER — METOPROLOL SUCCINATE ER 100 MG PO TB24: 100.0000 mg | ORAL_TABLET | Freq: Every day | ORAL | Status: DC

## 2016-05-22 MED ORDER — ATORVASTATIN CALCIUM 80 MG PO TABS: 80.0000 mg | ORAL_TABLET | Freq: Every day | ORAL | Status: DC

## 2016-05-22 MED ORDER — OMEGA-3-ACID ETHYL ESTERS 1 G PO CAPS: 4.0000 | ORAL_CAPSULE | Freq: Every day | ORAL | Status: DC

## 2016-05-22 MED ORDER — RAMIPRIL 2.5 MG PO CAPS: 2.5000 mg | ORAL_CAPSULE | Freq: Every day | ORAL | Status: DC

## 2016-05-22 NOTE — Telephone Encounter (Signed)
Encounter complete. 

## 2016-05-24 ENCOUNTER — Ambulatory Visit (HOSPITAL_COMMUNITY)
Admission: RE | Admit: 2016-05-24 | Discharge: 2016-05-24 | Disposition: A | Discharge status: Home / Self Care | Payer: BLUE CROSS/BLUE SHIELD | Source: Ambulatory Visit | Attending: Cardiovascular Disease | Admitting: Cardiovascular Disease

## 2016-05-24 DIAGNOSIS — E785 Hyperlipidemia, unspecified: Secondary | ICD-10-CM

## 2016-05-24 DIAGNOSIS — E669 Obesity, unspecified: Secondary | ICD-10-CM | POA: Insufficient documentation

## 2016-05-24 DIAGNOSIS — Z8249 Family history of ischemic heart disease and other diseases of the circulatory system: Secondary | ICD-10-CM | POA: Diagnosis not present

## 2016-05-24 DIAGNOSIS — I2510 Atherosclerotic heart disease of native coronary artery without angina pectoris: Secondary | ICD-10-CM | POA: Diagnosis not present

## 2016-05-24 DIAGNOSIS — I10 Essential (primary) hypertension: Secondary | ICD-10-CM

## 2016-05-24 DIAGNOSIS — E119 Type 2 diabetes mellitus without complications: Secondary | ICD-10-CM | POA: Diagnosis not present

## 2016-05-24 DIAGNOSIS — G4733 Obstructive sleep apnea (adult) (pediatric): Secondary | ICD-10-CM | POA: Insufficient documentation

## 2016-05-24 DIAGNOSIS — Z6832 Body mass index (BMI) 32.0-32.9, adult: Secondary | ICD-10-CM | POA: Diagnosis not present

## 2016-05-24 LAB — MYOCARDIAL PERFUSION IMAGING
CHL CUP MPHR: 161 {beats}/min
CHL CUP NUCLEAR SDS: 1
CHL CUP NUCLEAR SSS: 1
CSEPEW: 10.6 METS
CSEPHR: 93 %
CSEPPHR: 150 {beats}/min
Exercise duration (min): 9 min
Exercise duration (sec): 20 s
LV sys vol: 99 mL
LVDIAVOL: 173 mL (ref 62–150)
RPE: 17
Rest HR: 53 {beats}/min
SRS: 0
TID: 1.09

## 2016-05-24 MED ORDER — TECHNETIUM TC 99M TETROFOSMIN IV KIT
31.4000 | PACK | Freq: Once | INTRAVENOUS | Status: AC | PRN
  Administered 2016-05-24: 31 via INTRAVENOUS
  Filled 2016-05-24: qty 31

## 2016-05-24 MED ORDER — TECHNETIUM TC 99M TETROFOSMIN IV KIT
10.2000 | PACK | Freq: Once | INTRAVENOUS | Status: AC | PRN
  Administered 2016-05-24: 10 via INTRAVENOUS
  Filled 2016-05-24: qty 10

## 2016-05-28 ENCOUNTER — Telehealth: Payer: Self-pay | Admitting: Cardiology

## 2016-05-28 NOTE — Telephone Encounter (Signed)
Returned call to patient myoview results given. 

## 2016-05-28 NOTE — Telephone Encounter (Signed)
Returning your call. °

## 2016-05-29 ENCOUNTER — Encounter: Payer: Self-pay | Admitting: Cardiology

## 2016-07-30 DIAGNOSIS — E1129 Type 2 diabetes mellitus with other diabetic kidney complication: Secondary | ICD-10-CM | POA: Diagnosis not present

## 2016-07-30 DIAGNOSIS — Z125 Encounter for screening for malignant neoplasm of prostate: Secondary | ICD-10-CM | POA: Diagnosis not present

## 2016-07-30 DIAGNOSIS — Z0000 Encounter for general adult medical examination without abnormal findings: Secondary | ICD-10-CM | POA: Diagnosis not present

## 2016-07-30 DIAGNOSIS — E784 Other hyperlipidemia: Secondary | ICD-10-CM | POA: Diagnosis not present

## 2016-07-30 DIAGNOSIS — E291 Testicular hypofunction: Secondary | ICD-10-CM | POA: Diagnosis not present

## 2016-07-30 DIAGNOSIS — I10 Essential (primary) hypertension: Secondary | ICD-10-CM | POA: Diagnosis not present

## 2016-08-07 DIAGNOSIS — Z0000 Encounter for general adult medical examination without abnormal findings: Secondary | ICD-10-CM | POA: Diagnosis not present

## 2016-08-07 DIAGNOSIS — Z23 Encounter for immunization: Secondary | ICD-10-CM | POA: Diagnosis not present

## 2016-08-07 DIAGNOSIS — E1129 Type 2 diabetes mellitus with other diabetic kidney complication: Secondary | ICD-10-CM | POA: Diagnosis not present

## 2016-08-07 DIAGNOSIS — Z1389 Encounter for screening for other disorder: Secondary | ICD-10-CM | POA: Diagnosis not present

## 2016-08-07 DIAGNOSIS — I2510 Atherosclerotic heart disease of native coronary artery without angina pectoris: Secondary | ICD-10-CM | POA: Diagnosis not present

## 2016-08-14 DIAGNOSIS — Z1212 Encounter for screening for malignant neoplasm of rectum: Secondary | ICD-10-CM | POA: Diagnosis not present

## 2016-09-03 ENCOUNTER — Encounter: Payer: Self-pay | Admitting: Internal Medicine

## 2016-10-17 ENCOUNTER — Ambulatory Visit (AMBULATORY_SURGERY_CENTER): Payer: Self-pay | Admitting: *Deleted

## 2016-10-17 VITALS — Ht 75.0 in | Wt 263.0 lb

## 2016-10-17 DIAGNOSIS — Z1211 Encounter for screening for malignant neoplasm of colon: Secondary | ICD-10-CM

## 2016-10-17 MED ORDER — NA SULFATE-K SULFATE-MG SULF 17.5-3.13-1.6 GM/177ML PO SOLN: 1.0000 | Freq: Once | ORAL | 0 refills | Status: AC

## 2016-10-17 NOTE — Progress Notes (Signed)
No egg or soy allergy known to patient  No issues with past sedation with any surgeries  or procedures, no intubation problems  No diet pills per patient No home 02 use per patient  No blood thinners per patient  Pt denies issues with constipation  No A fib or A flutter   

## 2016-10-18 ENCOUNTER — Encounter: Payer: Self-pay | Admitting: Internal Medicine

## 2016-11-01 ENCOUNTER — Encounter: Payer: Self-pay | Admitting: Internal Medicine

## 2016-11-01 ENCOUNTER — Ambulatory Visit (AMBULATORY_SURGERY_CENTER): Payer: BLUE CROSS/BLUE SHIELD | Admitting: Internal Medicine

## 2016-11-01 VITALS — BP 95/66 | HR 56 | Temp 99.1°F | Resp 19 | Ht 75.0 in | Wt 263.0 lb

## 2016-11-01 DIAGNOSIS — D120 Benign neoplasm of cecum: Secondary | ICD-10-CM | POA: Diagnosis not present

## 2016-11-01 DIAGNOSIS — Z1211 Encounter for screening for malignant neoplasm of colon: Secondary | ICD-10-CM | POA: Diagnosis present

## 2016-11-01 DIAGNOSIS — Z1212 Encounter for screening for malignant neoplasm of rectum: Secondary | ICD-10-CM | POA: Diagnosis not present

## 2016-11-01 DIAGNOSIS — K37 Unspecified appendicitis: Secondary | ICD-10-CM | POA: Diagnosis not present

## 2016-11-01 LAB — GLUCOSE, CAPILLARY
GLUCOSE-CAPILLARY: 103 mg/dL — AB (ref 65–99)
GLUCOSE-CAPILLARY: 93 mg/dL (ref 65–99)

## 2016-11-01 MED ORDER — SODIUM CHLORIDE 0.9 % IV SOLN: 500.0000 mL | INTRAVENOUS | Status: AC

## 2016-11-01 NOTE — Progress Notes (Signed)
To recovery vss report to rn

## 2016-11-01 NOTE — Progress Notes (Signed)
Called to room to assist during endoscopic procedure.  Patient ID and intended procedure confirmed with present staff. Received instructions for my participation in the procedure from the performing physician.  

## 2016-11-01 NOTE — Op Note (Signed)
[place] Patient Name: Peter Gray Procedure Date: 11/01/2016 1:51 PM MRN: [id number] Endoscopist: Docia Chuck. Henrene Pastor , MD Age: 60 Referring MD:  Date of Birth: 05-01-1956 Gender: Male Account #: [id number] Procedure:                Colonoscopy, with cold snare polypectomy X 1 Indications:              Screening for colorectal malignant neoplasm Medicines:                Monitored Anesthesia Care Procedure:                Pre-Anesthesia Assessment:                           - Prior to the procedure, a History and Physical                            was performed, and patient medications and                            allergies were reviewed. The patient's tolerance of                            previous anesthesia was also reviewed. The risks                            and benefits of the procedure and the sedation                            options and risks were discussed with the patient.                            All questions were answered, and informed consent                            was obtained. Prior Anticoagulants: The patient has                            taken no previous anticoagulant or antiplatelet                            agents. ASA Grade Assessment: II - A patient with                            mild systemic disease. After reviewing the risks                            and benefits, the patient was deemed in                            satisfactory condition to undergo the procedure.                           After obtaining informed consent, the colonoscope  was passed under direct vision. Throughout the                            procedure, the patient's blood pressure, pulse, and                            oxygen saturations were monitored continuously. The                            Model CF-HQ190L [id number]) scope was introduced                            through the anus and advanced to the the cecum,             identified by appendiceal orifice and ileocecal                            valve. The ileocecal valve, appendiceal orifice,                            and rectum were photographed. The quality of the                            bowel preparation was excellent. The colonoscopy                            was performed without difficulty. The patient                            tolerated the procedure well. The bowel preparation                            used was SUPREP. Scope In: 1:55:42 PM Scope Out: 2:11:35 PM Scope Withdrawal Time: 0 hours 11 minutes 16 seconds  Total Procedure Duration: 0 hours 15 minutes 53 seconds  Findings:                 A 3 mm polyp was found in the cecum. The polyp was                            removed with a cold snare. Resection and retrieval                            were complete.                           Multiple small and large-mouthed diverticula were                            found in the sigmoid colon.                           Internal hemorrhoids were found during retroflexion.  The exam was otherwise without abnormality on                            direct and retroflexion views. Complications:            No immediate complications. Estimated blood loss:                            None. Estimated Blood Loss:     Estimated blood loss: none. Impression:               - One 3 mm polyp in the cecum, removed with a cold                            snare. Resected and retrieved.                           - Diverticulosis in the sigmoid colon.                           - Internal hemorrhoids.                           - The examination was otherwise normal on direct                            and retroflexion views. Recommendation:           - Repeat colonoscopy in 5-10 years for surveillance.                           - Patient has a contact number available for                            emergencies. The signs and  symptoms of potential                            delayed complications were discussed with the                            patient. Return to normal activities tomorrow.                            Written discharge instructions were provided to the                            patient.                           - Resume previous diet.                           - Continue present medications.                           - Await pathology results. Docia Chuck. Henrene Pastor, MD 11/01/2016 2:15:07 PM This report has been signed electronically.

## 2016-11-01 NOTE — Patient Instructions (Signed)
YOU HAD AN ENDOSCOPIC PROCEDURE TODAY AT THE [facility]:   Refer to the procedure report that was given to you for any specific questions about what was found during the examination.  If the procedure report does not answer your questions, please call your gastroenterologist to clarify.  If you requested that your care partner not be given the details of your procedure findings, then the procedure report has been included in a sealed envelope for you to review at your convenience later.  YOU SHOULD EXPECT: Some feelings of bloating in the abdomen. Passage of more gas than usual.  Walking can help get rid of the air that was put into your GI tract during the procedure and reduce the bloating. If you had a lower endoscopy (such as a colonoscopy or flexible sigmoidoscopy) you may notice spotting of blood in your stool or on the toilet paper. If you underwent a bowel prep for your procedure, you may not have a normal bowel movement for a few days.  Please Note:  You might notice some irritation and congestion in your nose or some drainage.  This is from the oxygen used during your procedure.  There is no need for concern and it should clear up in a day or so.  SYMPTOMS TO REPORT IMMEDIATELY:   Following lower endoscopy (colonoscopy or flexible sigmoidoscopy):  Excessive amounts of blood in the stool  Significant tenderness or worsening of abdominal pains  Swelling of the abdomen that is new, acute  Fever of 100F or higher   For urgent or emergent issues, a gastroenterologist can be reached at any hour by calling [phone].   DIET:  We do recommend a small meal at first, but then you may proceed to your regular diet.  Drink plenty of fluids but you should avoid alcoholic beverages for 24 hours. Try to increase the fiber in your diet, and drink plenty of water.  ACTIVITY:  You should plan to take it easy for the rest of today and you should NOT DRIVE or use heavy machinery until  tomorrow (because of the sedation medicines used during the test).    FOLLOW UP: Our staff will call the number listed on your records the next business day following your procedure to check on you and address any questions or concerns that you may have regarding the information given to you following your procedure. If we do not reach you, we will leave a message.  However, if you are feeling well and you are not experiencing any problems, there is no need to return our call.  We will assume that you have returned to your regular daily activities without incident.  If any biopsies were taken you will be contacted by phone or by letter within the next 1-3 weeks.  Please call us at [phone] if you have not heard about the biopsies in 3 weeks.    SIGNATURES/CONFIDENTIALITY: You and/or your care partner have signed paperwork which will be entered into your electronic medical record.  These signatures attest to the fact that that the information above on your After Visit Summary has been reviewed and is understood.  Full responsibility of the confidentiality of this discharge information lies with you and/or your care-partner.  Read all of the handouts given to you by your recovery room nurse.  Thank-you for choosing us for your healthcare needs today. 

## 2016-11-05 ENCOUNTER — Telehealth: Payer: Self-pay | Admitting: *Deleted

## 2016-11-05 NOTE — Telephone Encounter (Signed)
  Follow up Call-  Call back number 11/01/2016  Post procedure Call Back phone  # [phone]  Permission to leave phone message Yes  Some recent data might be hidden    [facility]

## 2016-11-13 ENCOUNTER — Encounter: Payer: Self-pay | Admitting: Internal Medicine

## 2016-12-19 DIAGNOSIS — Z1389 Encounter for screening for other disorder: Secondary | ICD-10-CM | POA: Diagnosis not present

## 2016-12-19 DIAGNOSIS — E291 Testicular hypofunction: Secondary | ICD-10-CM | POA: Diagnosis not present

## 2016-12-19 DIAGNOSIS — I2510 Atherosclerotic heart disease of native coronary artery without angina pectoris: Secondary | ICD-10-CM | POA: Diagnosis not present

## 2016-12-19 DIAGNOSIS — G4733 Obstructive sleep apnea (adult) (pediatric): Secondary | ICD-10-CM | POA: Diagnosis not present

## 2016-12-19 DIAGNOSIS — R05 Cough: Secondary | ICD-10-CM | POA: Diagnosis not present

## 2016-12-19 DIAGNOSIS — E1129 Type 2 diabetes mellitus with other diabetic kidney complication: Secondary | ICD-10-CM | POA: Diagnosis not present

## 2016-12-19 DIAGNOSIS — Z6834 Body mass index (BMI) 34.0-34.9, adult: Secondary | ICD-10-CM | POA: Diagnosis not present

## 2017-02-06 DIAGNOSIS — E291 Testicular hypofunction: Secondary | ICD-10-CM | POA: Diagnosis not present

## 2017-02-06 DIAGNOSIS — I2510 Atherosclerotic heart disease of native coronary artery without angina pectoris: Secondary | ICD-10-CM | POA: Diagnosis not present

## 2017-02-06 DIAGNOSIS — Z1389 Encounter for screening for other disorder: Secondary | ICD-10-CM | POA: Diagnosis not present

## 2017-02-06 DIAGNOSIS — E1129 Type 2 diabetes mellitus with other diabetic kidney complication: Secondary | ICD-10-CM | POA: Diagnosis not present

## 2017-02-06 DIAGNOSIS — Z8719 Personal history of other diseases of the digestive system: Secondary | ICD-10-CM | POA: Diagnosis not present

## 2017-02-06 DIAGNOSIS — L97529 Non-pressure chronic ulcer of other part of left foot with unspecified severity: Secondary | ICD-10-CM | POA: Diagnosis not present

## 2017-02-06 DIAGNOSIS — E784 Other hyperlipidemia: Secondary | ICD-10-CM | POA: Diagnosis not present

## 2017-03-11 DIAGNOSIS — H40011 Open angle with borderline findings, low risk, right eye: Secondary | ICD-10-CM | POA: Diagnosis not present

## 2017-03-11 DIAGNOSIS — E119 Type 2 diabetes mellitus without complications: Secondary | ICD-10-CM | POA: Diagnosis not present

## 2017-03-11 DIAGNOSIS — H2513 Age-related nuclear cataract, bilateral: Secondary | ICD-10-CM | POA: Diagnosis not present

## 2017-03-11 DIAGNOSIS — H40013 Open angle with borderline findings, low risk, bilateral: Secondary | ICD-10-CM | POA: Diagnosis not present

## 2017-03-11 DIAGNOSIS — H25013 Cortical age-related cataract, bilateral: Secondary | ICD-10-CM | POA: Diagnosis not present

## 2017-03-11 DIAGNOSIS — H524 Presbyopia: Secondary | ICD-10-CM | POA: Diagnosis not present

## 2017-03-11 DIAGNOSIS — H40012 Open angle with borderline findings, low risk, left eye: Secondary | ICD-10-CM | POA: Diagnosis not present

## 2017-03-18 ENCOUNTER — Other Ambulatory Visit: Payer: Self-pay | Admitting: *Deleted

## 2017-03-18 MED ORDER — OMEGA-3-ACID ETHYL ESTERS 1 G PO CAPS: 4.0000 | ORAL_CAPSULE | Freq: Every day | ORAL | 0 refills | Status: DC

## 2017-04-15 ENCOUNTER — Other Ambulatory Visit: Payer: Self-pay

## 2017-04-15 MED ORDER — OMEGA-3-ACID ETHYL ESTERS 1 G PO CAPS: 4.0000 | ORAL_CAPSULE | Freq: Every day | ORAL | 0 refills | Status: DC

## 2017-04-21 DIAGNOSIS — M25561 Pain in right knee: Secondary | ICD-10-CM | POA: Diagnosis not present

## 2017-04-23 DIAGNOSIS — I2510 Atherosclerotic heart disease of native coronary artery without angina pectoris: Secondary | ICD-10-CM | POA: Diagnosis not present

## 2017-04-23 DIAGNOSIS — E1129 Type 2 diabetes mellitus with other diabetic kidney complication: Secondary | ICD-10-CM | POA: Diagnosis not present

## 2017-04-23 DIAGNOSIS — E784 Other hyperlipidemia: Secondary | ICD-10-CM | POA: Diagnosis not present

## 2017-04-23 DIAGNOSIS — I10 Essential (primary) hypertension: Secondary | ICD-10-CM | POA: Diagnosis not present

## 2017-05-05 DIAGNOSIS — R0689 Other abnormalities of breathing: Secondary | ICD-10-CM | POA: Diagnosis not present

## 2017-05-05 DIAGNOSIS — G4733 Obstructive sleep apnea (adult) (pediatric): Secondary | ICD-10-CM | POA: Diagnosis not present

## 2017-05-15 ENCOUNTER — Other Ambulatory Visit: Payer: Self-pay | Admitting: Cardiology

## 2017-05-15 MED ORDER — METOPROLOL SUCCINATE ER 100 MG PO TB24: 100.0000 mg | ORAL_TABLET | Freq: Every day | ORAL | 0 refills | Status: DC

## 2017-05-16 ENCOUNTER — Other Ambulatory Visit: Payer: Self-pay

## 2017-05-16 MED ORDER — OMEGA-3-ACID ETHYL ESTERS 1 G PO CAPS: 4.0000 | ORAL_CAPSULE | Freq: Every day | ORAL | 0 refills | Status: DC

## 2017-05-22 DIAGNOSIS — M519 Unspecified thoracic, thoracolumbar and lumbosacral intervertebral disc disorder: Secondary | ICD-10-CM | POA: Diagnosis not present

## 2017-05-22 DIAGNOSIS — M5126 Other intervertebral disc displacement, lumbar region: Secondary | ICD-10-CM | POA: Diagnosis not present

## 2017-06-13 ENCOUNTER — Telehealth: Payer: Self-pay | Admitting: Cardiology

## 2017-06-13 MED ORDER — OMEGA-3-ACID ETHYL ESTERS 1 G PO CAPS: 4.0000 | ORAL_CAPSULE | Freq: Every day | ORAL | 6 refills | Status: DC

## 2017-06-13 MED ORDER — RAMIPRIL 2.5 MG PO CAPS: 2.5000 mg | ORAL_CAPSULE | Freq: Every day | ORAL | 6 refills | Status: DC

## 2017-06-13 MED ORDER — ATORVASTATIN CALCIUM 80 MG PO TABS: 80.0000 mg | ORAL_TABLET | Freq: Every day | ORAL | 6 refills | Status: DC

## 2017-06-13 MED ORDER — METOPROLOL SUCCINATE ER 100 MG PO TB24: 100.0000 mg | ORAL_TABLET | Freq: Every day | ORAL | 6 refills | Status: DC

## 2017-06-13 NOTE — Telephone Encounter (Signed)
New message    Pt is calling. He needs his medication.    *STAT* If patient is at the pharmacy, call can be transferred to refill team.   1. Which medications need to be refilled? (please list name of each medication and dose if known) omega 3, metoprolol, atorvastatin, ramipril  2. Which pharmacy/location (including street and city if local pharmacy) is medication to be sent to? [facility] on [address]  3. Do they need a 30 day or 90 day supply? 30 day

## 2017-06-13 NOTE — Telephone Encounter (Signed)
Refill sent to the pharmacy electronically.  

## 2017-07-25 ENCOUNTER — Telehealth: Payer: Self-pay | Admitting: Cardiology

## 2017-07-25 MED ORDER — RAMIPRIL 2.5 MG PO CAPS: 2.5000 mg | ORAL_CAPSULE | Freq: Every day | ORAL | 6 refills | Status: DC

## 2017-07-25 NOTE — Telephone Encounter (Signed)
New message       *STAT* If patient is at the pharmacy, call can be transferred to refill team.   1. Which medications need to be refilled? (please list name of each medication and dose if known) ramipril 2.5mg  2. Which pharmacy/location (including street and city if local pharmacy) is medication to be sent to? [facility] at [address] 3. Do they need a 30 day or 90 day supply? 30 day----need enough to last until 10-12 appt

## 2017-08-20 NOTE — Progress Notes (Signed)
Peter Gray. Date of Birth: 1956/07/02 Medical Record #[id number]  History of Present Illness: Peter Gray is seen back today for follow up CAD. He has known CAD with lateral MI in 2008 and emergent stenting of the LCX with BMS. Normal nuclear study in March 2014 and July 2017. Other issues are HLD, DM,  and OSA.   He reports he is doing well without chest pain, dyspnea, palpitations, or edema. He admits his diet is poor and he doesn't exercise like he should. He has gained 11 lbs this year. Last A1c 7.6%. Comes in for refill of cardiac meds.   Current Outpatient Prescriptions on File Prior to Visit  Medication Sig Dispense Refill  . ANDRODERM 2 MG/24HR PT24 Apply 1 patch topically daily.  5  . aspirin 325 MG tablet Take 325 mg by mouth daily.    Marland Kitchen atorvastatin (LIPITOR) 80 MG tablet Take 1 tablet (80 mg total) by mouth daily. 30 tablet 6  . cetirizine (ZYRTEC) 10 MG tablet Take 10 mg by mouth daily.    . Cholecalciferol (VITAMIN D3) 3000 UNITS TABS Take 3,000 Units by mouth.    . docusate sodium (COLACE) 100 MG capsule Take 100 mg by mouth daily.    . indomethacin (INDOCIN SR) 75 MG CR capsule Take 75 mg by mouth daily as needed for mild pain.     . metFORMIN (GLUCOPHAGE) 500 MG tablet Take by mouth daily with breakfast.    . metoprolol succinate (TOPROL-XL) 100 MG 24 hr tablet Take 1 tablet (100 mg total) by mouth daily. Take with or immediately following a meal. 30 tablet 6  . Multiple Vitamin (MULTIVITAMIN) tablet Take 1 tablet by mouth daily.    . nitroGLYCERIN (NITROSTAT) 0.4 MG SL tablet Place 1 tablet (0.4 mg total) under the tongue every 5 (five) minutes as needed for chest pain. 25 tablet 1  . omega-3 acid ethyl esters (LOVAZA) 1 g capsule Take 4 capsules (4 g total) by mouth daily. 120 capsule 6  . ramipril (ALTACE) 2.5 MG capsule Take 1 capsule (2.5 mg total) by mouth daily. 30 capsule 6   Current Facility-Administered Medications on File Prior to Visit  Medication Dose Route  Frequency Provider Last Rate Last Dose  . 0.9 %  sodium chloride infusion  500 mL Intravenous Continuous Peter Shipper, MD        Allergies  Allergen Reactions  . Bee Venom Swelling    Past Medical History:  Diagnosis Date  . Allergy   . Arthritis    joints- in general   . Coronary artery disease   . Diabetes mellitus without complication (HCC)    Hgb A1c- has lately reduced to 5.6, per management by Peter Gray, has lost 30 +lbs.  . Encounter for blood transfusion    as a newborn, on two occasions had bld. transfusion  . GERD (gastroesophageal reflux disease)   . Gout   . Hepatitis   . Hyperlipidemia   . Hypertension   . Myocardial infarction ([place]) 2008  . Neuromuscular disorder ([place])    funny sensations in both feet on & off, nerves chronically traumatized with problemed feet    . Obesity   . Sleep apnea    uses CPAP- last study- long ago    Past Surgical History:  Procedure Laterality Date  . CORONARY ANGIOPLASTY WITH STENT PLACEMENT    . dental implant    . ELBOW SURGERY Right 1984   bone spur removed   . INSERTION  OF MESH N/A 08/31/2014   Procedure: INSERTION OF MESH;  Surgeon: Peter Mesa, MD;  Location: [facility];  Service: General;  Laterality: N/A;  . JOINT REPLACEMENT Left 2010   hip  . SIGMOIDOSCOPY     30 yrs ago   . UMBILICAL HERNIA REPAIR  08/31/2014   WITH [facility]         Peter Gray  . UMBILICAL HERNIA REPAIR N/A 08/31/2014   Procedure: HERNIA REPAIR UMBILICAL ADULT;  Surgeon: Peter Mesa, MD;  Location: [place] [facility];  Service: General;  Laterality: N/A;  . VASECTOMY      History  Smoking Status  . Never Smoker  Smokeless Tobacco  . Never Used    History  Alcohol Use  . Yes    Comment: occas,- less than social     Family History  Problem Relation Age of Onset  . Coronary artery disease Mother   . Breast cancer Mother   . Other Father   . Coronary artery disease Father   . Heart failure Father   . Colon cancer Neg Hx   . Colon polyps Neg Hx      Review of Systems: The review of systems is per the HPI.  All other systems were reviewed and are negative.  Physical Exam: BP 122/74   Pulse 66   Ht 6\' 3"  (1.905 m)   Wt 274 lb 12.8 oz (124.6 kg)   BMI 34.35 kg/m  GENERAL:  Well appearing, obese WM in NAD HEENT:  PERRL, EOMI, sclera are clear. Oropharynx is clear. NECK:  No jugular venous distention, carotid upstroke brisk and symmetric, no bruits, no thyromegaly or adenopathy LUNGS:  Clear to auscultation bilaterally CHEST:  Unremarkable HEART:  RRR,  PMI not displaced or sustained,S1 and S2 within normal limits, no S3, no S4: no clicks, no rubs, no murmurs ABD:  Soft, obese,nontender. BS +, no masses or bruits. No hepatomegaly, no splenomegaly EXT:  2 + pulses throughout, no edema, no cyanosis no clubbing SKIN:  Warm and dry.  No rashes NEURO:  Alert and oriented x 3. Cranial nerves II through XII intact. PSYCH:  Cognitively intact     LABORATORY DATA: Lab Results  Component Value Date   WBC 6.7 08/22/2014   HGB 13.3 08/22/2014   HCT 38.2 (L) 08/22/2014   PLT 183 08/22/2014   GLUCOSE 96 08/31/2014   CHOL 150 01/28/2014   TRIG 149.0 01/28/2014   HDL 37.40 (L) 01/28/2014   LDLCALC 83 01/28/2014   ALT 29 01/28/2014   AST 28 01/28/2014   NA 142 08/31/2014   K 4.9 08/31/2014   CL 102 08/31/2014   CREATININE 1.13 08/31/2014   BUN 21 08/31/2014   CO2 28 08/31/2014   PSA 0.75 01/28/2014   INR 1.17 09/06/2009   HGBA1C 9.4 (H) 01/28/2014   Dated 07/30/16: cholesterol 130, triglycerides 77, HDL 46, LDL 69. Creatinine 1.3. Other chemistries, TSH, CBC normal.  Dated 04/23/17: A1c 7.6%.  Myoview 05/24/16: Study Highlights    Nuclear stress EF: 43%. Visually , the EF appears to be greater than the computer generated EF of 43%. The EF is 50-55% visually  There was no ST segment deviation noted during stress.  The study is normal.  This is a low risk study.    Ecg today shows NSR with normal Ecg. I have personally  reviewed and interpreted this study.   Assessment / Plan: 1. CAD - he is asymptomatic. Myoview last year looked good.  Continue to work on  risk factor modification.  Continue ASA and Toprol XL. I told him he can reduce ASA to 81 mg daily.   2. HLD -  Continue high dose statin and lovaza. He is due for follow up lab work and has this scheduled with Peter Gray next month.   3. Obesity -  encouraged life style changes and continued weight loss..   4. Diabetes mellitus. On metformin. Managed by Peter Gray. If additional therapy needed I would consider a SGLT 2 inhibitor given reduced risk of CV events.   5. OSA on CPAP therapy.  Prescriptions refilled. I will follow up in one year.

## 2017-08-21 DIAGNOSIS — Z23 Encounter for immunization: Secondary | ICD-10-CM | POA: Diagnosis not present

## 2017-08-22 ENCOUNTER — Ambulatory Visit (INDEPENDENT_AMBULATORY_CARE_PROVIDER_SITE_OTHER): Payer: BLUE CROSS/BLUE SHIELD | Admitting: Cardiology

## 2017-08-22 ENCOUNTER — Other Ambulatory Visit: Payer: Self-pay

## 2017-08-22 ENCOUNTER — Encounter: Payer: Self-pay | Admitting: Cardiology

## 2017-08-22 VITALS — BP 122/74 | HR 66 | Ht 75.0 in | Wt 274.8 lb

## 2017-08-22 DIAGNOSIS — I10 Essential (primary) hypertension: Secondary | ICD-10-CM

## 2017-08-22 DIAGNOSIS — E7800 Pure hypercholesterolemia, unspecified: Secondary | ICD-10-CM

## 2017-08-22 DIAGNOSIS — I2510 Atherosclerotic heart disease of native coronary artery without angina pectoris: Secondary | ICD-10-CM | POA: Diagnosis not present

## 2017-08-22 MED ORDER — ATORVASTATIN CALCIUM 80 MG PO TABS: 80.0000 mg | ORAL_TABLET | Freq: Every day | ORAL | 3 refills | Status: DC

## 2017-08-22 MED ORDER — RAMIPRIL 2.5 MG PO CAPS: 2.5000 mg | ORAL_CAPSULE | Freq: Every day | ORAL | 3 refills | Status: DC

## 2017-08-22 MED ORDER — METOPROLOL SUCCINATE ER 100 MG PO TB24: 100.0000 mg | ORAL_TABLET | Freq: Every day | ORAL | 3 refills | Status: DC

## 2017-08-22 MED ORDER — OMEGA-3-ACID ETHYL ESTERS 1 G PO CAPS: 4.0000 | ORAL_CAPSULE | Freq: Every day | ORAL | 3 refills | Status: DC

## 2017-08-22 NOTE — Patient Instructions (Signed)
Continue your current therapy  You may reduce ASA to 81 mg daily  You need to exercise more regularly and lose weight  I will see you in one year

## 2017-09-18 DIAGNOSIS — Z125 Encounter for screening for malignant neoplasm of prostate: Secondary | ICD-10-CM | POA: Diagnosis not present

## 2017-09-18 DIAGNOSIS — Z0000 Encounter for general adult medical examination without abnormal findings: Secondary | ICD-10-CM | POA: Diagnosis not present

## 2017-09-18 DIAGNOSIS — G4733 Obstructive sleep apnea (adult) (pediatric): Secondary | ICD-10-CM | POA: Diagnosis not present

## 2017-09-18 DIAGNOSIS — I10 Essential (primary) hypertension: Secondary | ICD-10-CM | POA: Diagnosis not present

## 2017-09-18 DIAGNOSIS — E7849 Other hyperlipidemia: Secondary | ICD-10-CM | POA: Diagnosis not present

## 2017-09-18 DIAGNOSIS — E1129 Type 2 diabetes mellitus with other diabetic kidney complication: Secondary | ICD-10-CM | POA: Diagnosis not present

## 2017-09-25 DIAGNOSIS — E1129 Type 2 diabetes mellitus with other diabetic kidney complication: Secondary | ICD-10-CM | POA: Diagnosis not present

## 2017-09-25 DIAGNOSIS — G4733 Obstructive sleep apnea (adult) (pediatric): Secondary | ICD-10-CM | POA: Diagnosis not present

## 2017-09-25 DIAGNOSIS — Z23 Encounter for immunization: Secondary | ICD-10-CM | POA: Diagnosis not present

## 2017-09-25 DIAGNOSIS — E291 Testicular hypofunction: Secondary | ICD-10-CM | POA: Diagnosis not present

## 2017-09-25 DIAGNOSIS — I129 Hypertensive chronic kidney disease with stage 1 through stage 4 chronic kidney disease, or unspecified chronic kidney disease: Secondary | ICD-10-CM | POA: Diagnosis not present

## 2017-09-25 DIAGNOSIS — Z0000 Encounter for general adult medical examination without abnormal findings: Secondary | ICD-10-CM | POA: Diagnosis not present

## 2017-10-14 DIAGNOSIS — N183 Chronic kidney disease, stage 3 (moderate): Secondary | ICD-10-CM | POA: Diagnosis not present

## 2017-10-14 DIAGNOSIS — I10 Essential (primary) hypertension: Secondary | ICD-10-CM | POA: Diagnosis not present

## 2017-10-14 DIAGNOSIS — E1129 Type 2 diabetes mellitus with other diabetic kidney complication: Secondary | ICD-10-CM | POA: Diagnosis not present

## 2017-10-14 DIAGNOSIS — Z6834 Body mass index (BMI) 34.0-34.9, adult: Secondary | ICD-10-CM | POA: Diagnosis not present

## 2018-01-30 ENCOUNTER — Other Ambulatory Visit: Payer: Self-pay | Admitting: *Deleted

## 2018-02-02 MED ORDER — NITROGLYCERIN 0.4 MG SL SUBL: 0.4000 mg | SUBLINGUAL_TABLET | SUBLINGUAL | 1 refills | Status: DC | PRN

## 2018-02-06 IMAGING — CR DG TIBIA/FIBULA 2V*R*
4 series · 4 of 4 positions shown · non-contrast
Comparison: None.

CLINICAL DATA: Laceration at the right posterior and lateral lower
leg. Initial encounter.

EXAM:
RIGHT TIBIA AND FIBULA - 2 VIEW

[t tib-fib ap right (1 of 2)]
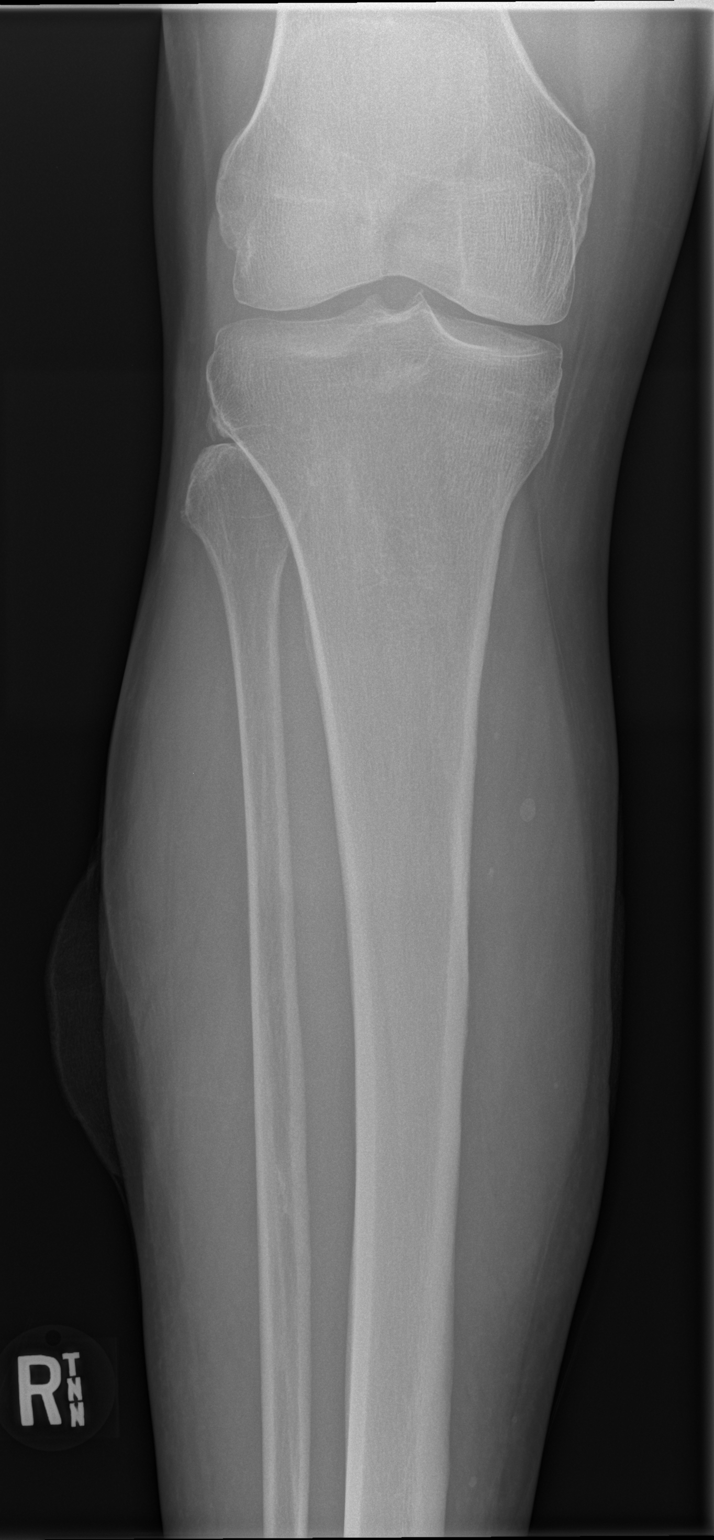

[t tib-fib ap right (2 of 2)]
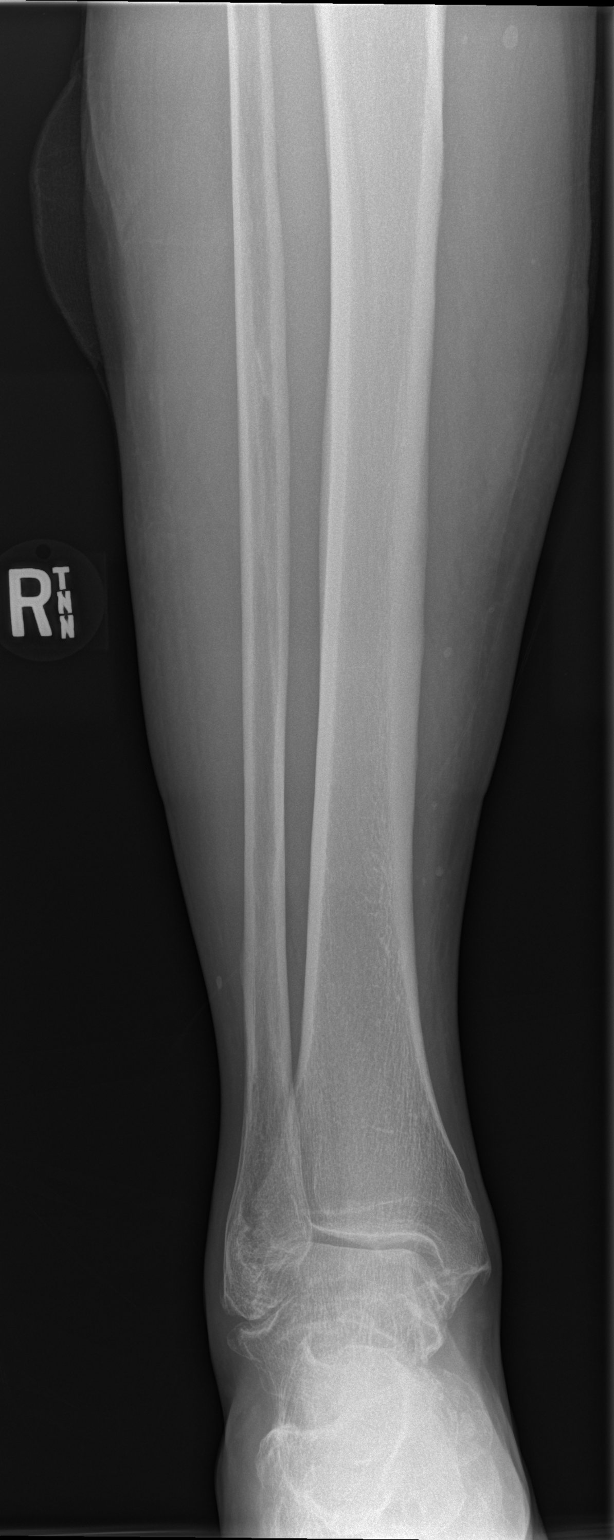

[t tib-fib lat right (1 of 2)]
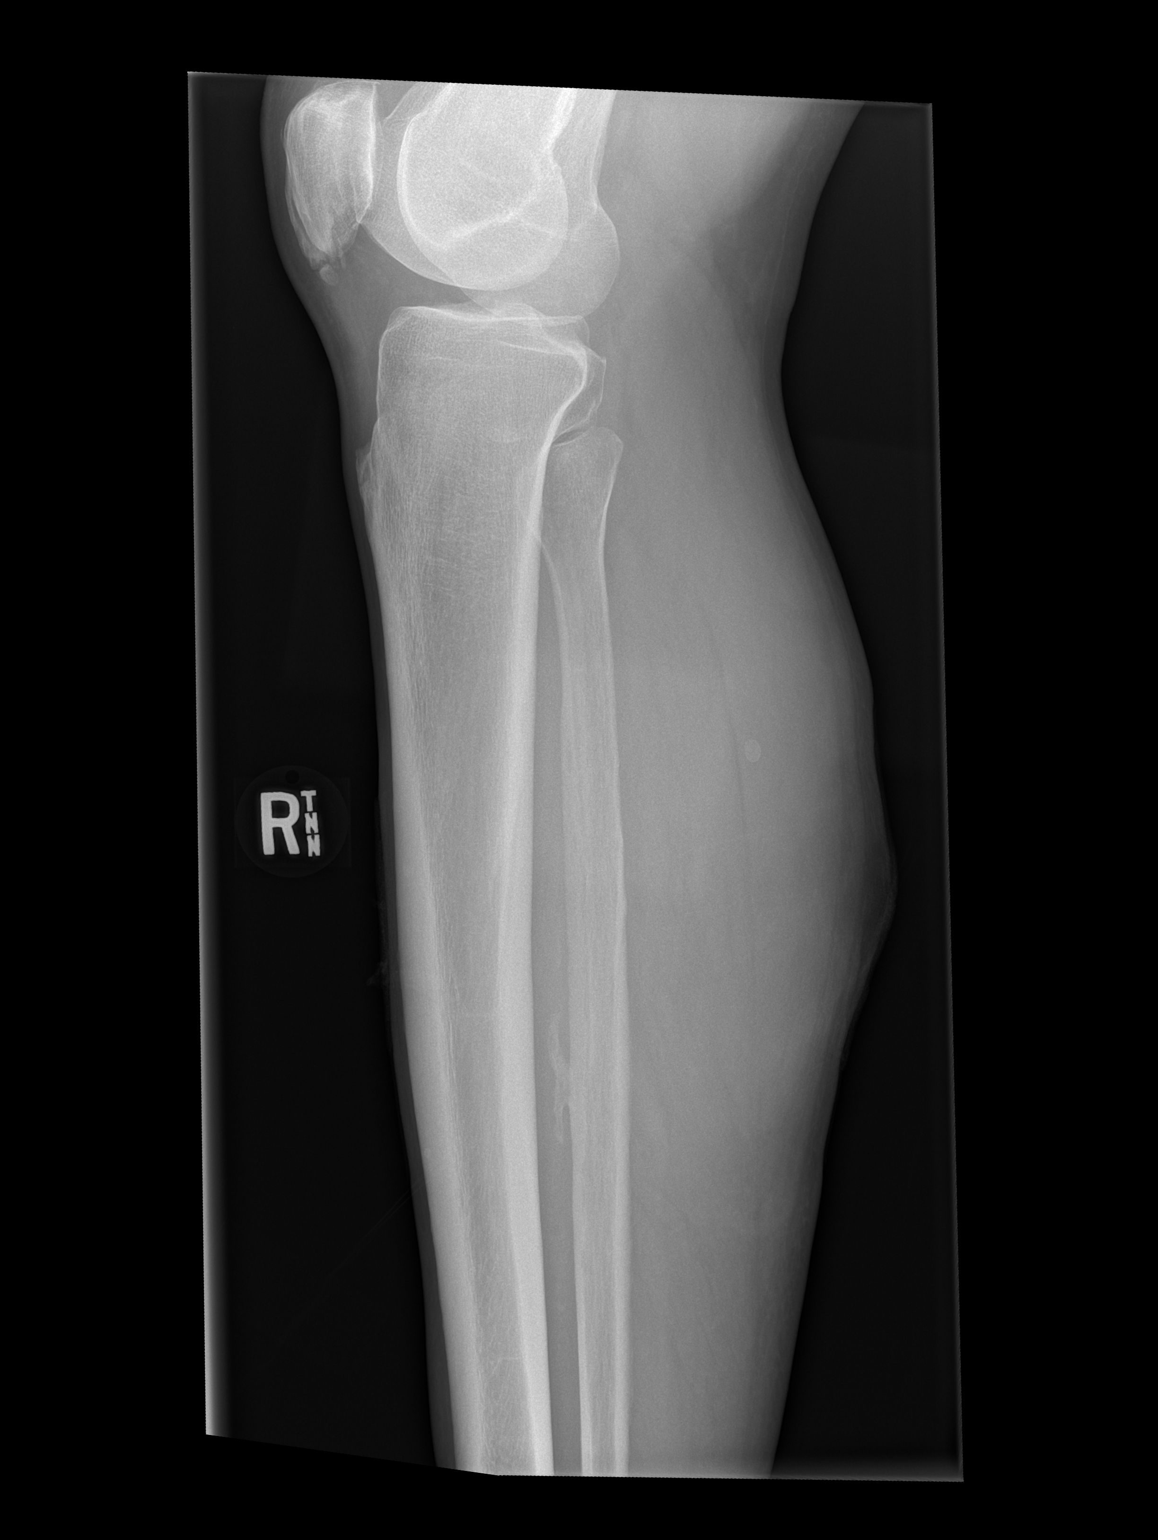

[t tib-fib lat right (2 of 2)]
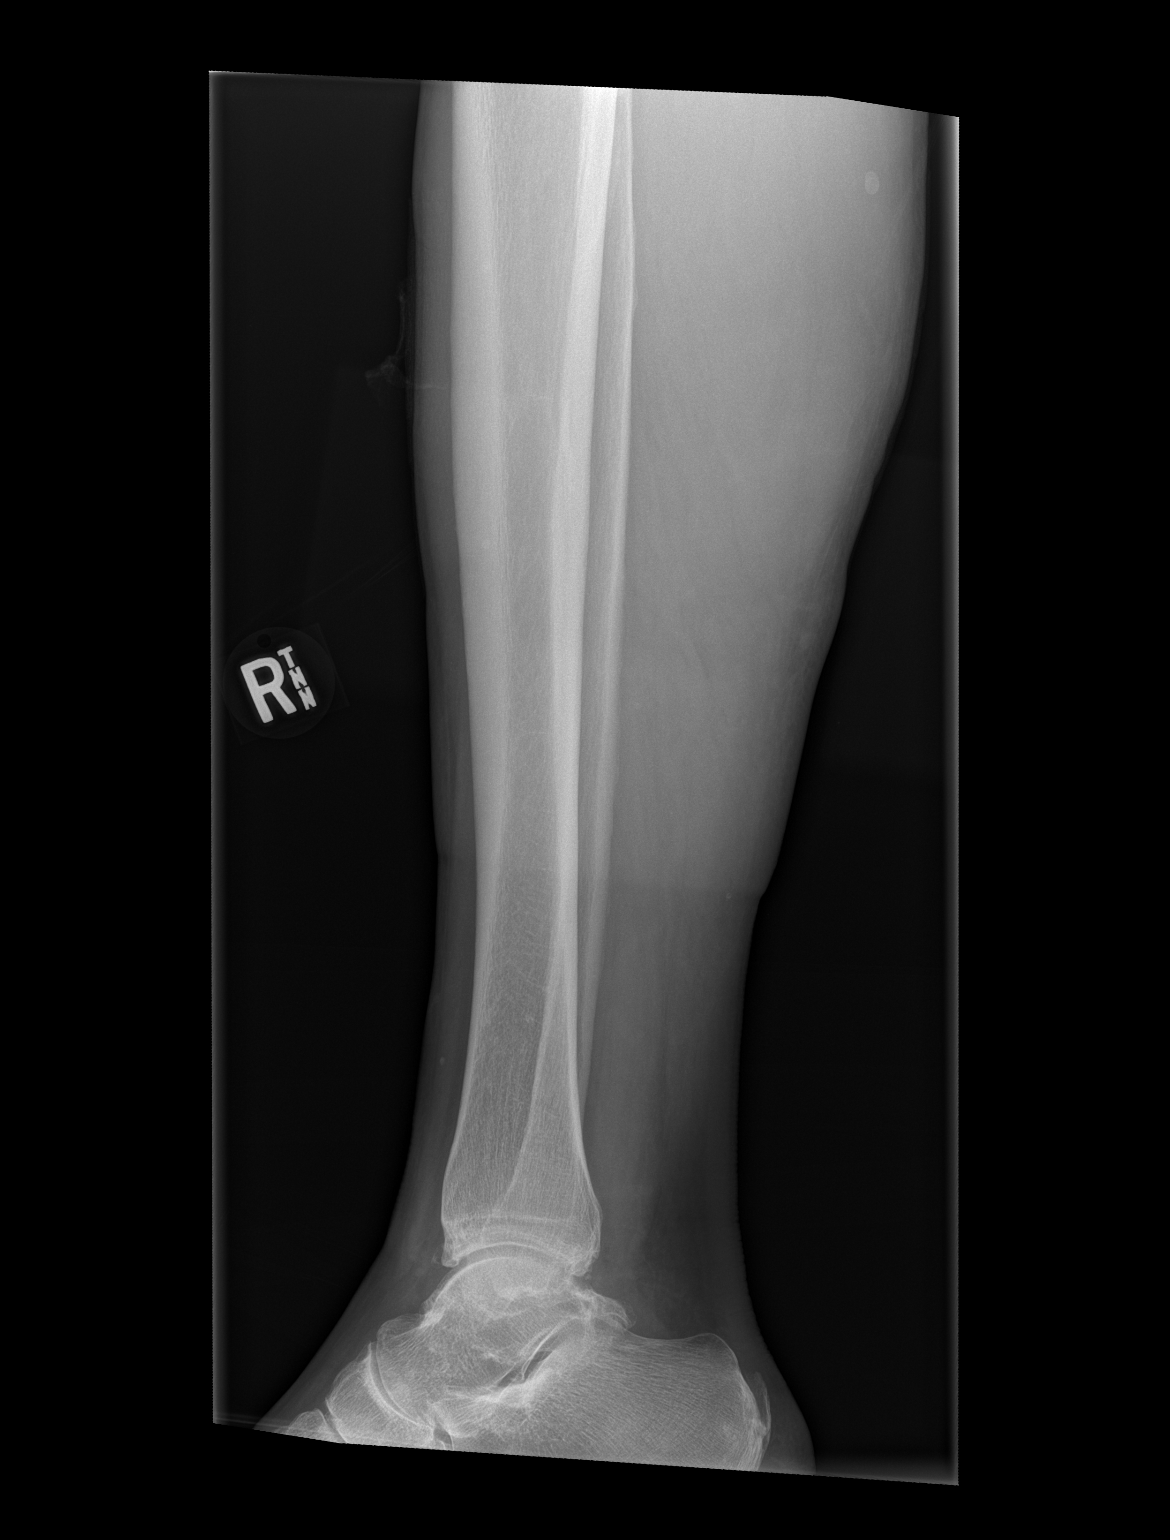

[4 of 4 positions shown; findings below may reference images not displayed]

FINDINGS: The known soft tissue laceration is partially characterized. No
radiopaque foreign bodies are seen.

The tibia and fibula are grossly unremarkable in appearance. The
ankle mortise is incompletely assessed, but appears grossly
unremarkable. The knee joint is incompletely assessed. A small
accessory ossicle is noted at the inferior pole of the patella.
Scattered soft tissue calcifications are seen.
IMPRESSION: No radiopaque foreign bodies seen. No evidence of fracture or
dislocation.

## 2018-02-09 DIAGNOSIS — E1129 Type 2 diabetes mellitus with other diabetic kidney complication: Secondary | ICD-10-CM | POA: Diagnosis not present

## 2018-02-09 DIAGNOSIS — Z1389 Encounter for screening for other disorder: Secondary | ICD-10-CM | POA: Diagnosis not present

## 2018-02-09 DIAGNOSIS — E669 Obesity, unspecified: Secondary | ICD-10-CM | POA: Diagnosis not present

## 2018-02-09 DIAGNOSIS — Z6833 Body mass index (BMI) 33.0-33.9, adult: Secondary | ICD-10-CM | POA: Diagnosis not present

## 2018-02-09 DIAGNOSIS — I129 Hypertensive chronic kidney disease with stage 1 through stage 4 chronic kidney disease, or unspecified chronic kidney disease: Secondary | ICD-10-CM | POA: Diagnosis not present

## 2018-04-12 ENCOUNTER — Emergency Department (HOSPITAL_COMMUNITY): Payer: BLUE CROSS/BLUE SHIELD

## 2018-04-12 ENCOUNTER — Other Ambulatory Visit: Payer: Self-pay

## 2018-04-12 ENCOUNTER — Observation Stay (HOSPITAL_COMMUNITY)
Admission: EM | Admit: 2018-04-12 | Discharge: 2018-04-13 | Disposition: A | Discharge status: Home / Self Care | Payer: BLUE CROSS/BLUE SHIELD | Attending: Internal Medicine | Admitting: Internal Medicine

## 2018-04-12 ENCOUNTER — Encounter (HOSPITAL_COMMUNITY): Payer: Self-pay | Admitting: Emergency Medicine

## 2018-04-12 DIAGNOSIS — E669 Obesity, unspecified: Secondary | ICD-10-CM | POA: Insufficient documentation

## 2018-04-12 DIAGNOSIS — Z7982 Long term (current) use of aspirin: Secondary | ICD-10-CM | POA: Diagnosis not present

## 2018-04-12 DIAGNOSIS — R109 Unspecified abdominal pain: Secondary | ICD-10-CM | POA: Diagnosis present

## 2018-04-12 DIAGNOSIS — N201 Calculus of ureter: Secondary | ICD-10-CM | POA: Diagnosis present

## 2018-04-12 DIAGNOSIS — Z79899 Other long term (current) drug therapy: Secondary | ICD-10-CM | POA: Diagnosis not present

## 2018-04-12 DIAGNOSIS — Z8249 Family history of ischemic heart disease and other diseases of the circulatory system: Secondary | ICD-10-CM | POA: Diagnosis not present

## 2018-04-12 DIAGNOSIS — N179 Acute kidney failure, unspecified: Secondary | ICD-10-CM | POA: Insufficient documentation

## 2018-04-12 DIAGNOSIS — E1129 Type 2 diabetes mellitus with other diabetic kidney complication: Secondary | ICD-10-CM | POA: Diagnosis present

## 2018-04-12 DIAGNOSIS — G4733 Obstructive sleep apnea (adult) (pediatric): Secondary | ICD-10-CM | POA: Diagnosis not present

## 2018-04-12 DIAGNOSIS — Z7984 Long term (current) use of oral hypoglycemic drugs: Secondary | ICD-10-CM | POA: Insufficient documentation

## 2018-04-12 DIAGNOSIS — I700 Atherosclerosis of aorta: Secondary | ICD-10-CM | POA: Diagnosis not present

## 2018-04-12 DIAGNOSIS — N1330 Unspecified hydronephrosis: Secondary | ICD-10-CM | POA: Diagnosis present

## 2018-04-12 DIAGNOSIS — E785 Hyperlipidemia, unspecified: Secondary | ICD-10-CM | POA: Diagnosis not present

## 2018-04-12 DIAGNOSIS — I129 Hypertensive chronic kidney disease with stage 1 through stage 4 chronic kidney disease, or unspecified chronic kidney disease: Secondary | ICD-10-CM | POA: Diagnosis not present

## 2018-04-12 DIAGNOSIS — Z91030 Bee allergy status: Secondary | ICD-10-CM | POA: Insufficient documentation

## 2018-04-12 DIAGNOSIS — Z803 Family history of malignant neoplasm of breast: Secondary | ICD-10-CM | POA: Diagnosis not present

## 2018-04-12 DIAGNOSIS — I10 Essential (primary) hypertension: Secondary | ICD-10-CM | POA: Diagnosis present

## 2018-04-12 DIAGNOSIS — N135 Crossing vessel and stricture of ureter without hydronephrosis: Secondary | ICD-10-CM | POA: Diagnosis not present

## 2018-04-12 DIAGNOSIS — N23 Unspecified renal colic: Secondary | ICD-10-CM | POA: Diagnosis present

## 2018-04-12 DIAGNOSIS — Z6834 Body mass index (BMI) 34.0-34.9, adult: Secondary | ICD-10-CM | POA: Diagnosis not present

## 2018-04-12 DIAGNOSIS — K5730 Diverticulosis of large intestine without perforation or abscess without bleeding: Secondary | ICD-10-CM | POA: Diagnosis not present

## 2018-04-12 DIAGNOSIS — K219 Gastro-esophageal reflux disease without esophagitis: Secondary | ICD-10-CM | POA: Diagnosis not present

## 2018-04-12 DIAGNOSIS — Z96642 Presence of left artificial hip joint: Secondary | ICD-10-CM | POA: Insufficient documentation

## 2018-04-12 DIAGNOSIS — M109 Gout, unspecified: Secondary | ICD-10-CM | POA: Diagnosis not present

## 2018-04-12 DIAGNOSIS — I2510 Atherosclerotic heart disease of native coronary artery without angina pectoris: Secondary | ICD-10-CM | POA: Insufficient documentation

## 2018-04-12 DIAGNOSIS — E1122 Type 2 diabetes mellitus with diabetic chronic kidney disease: Secondary | ICD-10-CM | POA: Insufficient documentation

## 2018-04-12 DIAGNOSIS — N132 Hydronephrosis with renal and ureteral calculous obstruction: Principal | ICD-10-CM | POA: Insufficient documentation

## 2018-04-12 DIAGNOSIS — Z809 Family history of malignant neoplasm, unspecified: Secondary | ICD-10-CM | POA: Insufficient documentation

## 2018-04-12 DIAGNOSIS — Z955 Presence of coronary angioplasty implant and graft: Secondary | ICD-10-CM | POA: Diagnosis not present

## 2018-04-12 DIAGNOSIS — M1990 Unspecified osteoarthritis, unspecified site: Secondary | ICD-10-CM | POA: Diagnosis not present

## 2018-04-12 DIAGNOSIS — I252 Old myocardial infarction: Secondary | ICD-10-CM | POA: Insufficient documentation

## 2018-04-12 DIAGNOSIS — N200 Calculus of kidney: Secondary | ICD-10-CM

## 2018-04-12 DIAGNOSIS — N183 Chronic kidney disease, stage 3 unspecified: Secondary | ICD-10-CM | POA: Diagnosis present

## 2018-04-12 LAB — URINALYSIS, ROUTINE W REFLEX MICROSCOPIC
BILIRUBIN URINE: NEGATIVE
Bacteria, UA: NONE SEEN
Glucose, UA: NEGATIVE mg/dL
Ketones, ur: NEGATIVE mg/dL
LEUKOCYTES UA: NEGATIVE
Nitrite: NEGATIVE
PROTEIN: NEGATIVE mg/dL
SPECIFIC GRAVITY, URINE: 1.003 — AB (ref 1.005–1.030)
pH: 5 (ref 5.0–8.0)

## 2018-04-12 NOTE — ED Notes (Signed)
Pt to CT

## 2018-04-12 NOTE — ED Triage Notes (Signed)
Pt c/o R flank pain radiating to RLQ intermittent x 3 days, mild nausea at times. Pt pacing in triage.

## 2018-04-12 NOTE — ED Provider Notes (Addendum)
[facility] EMERGENCY DEPARTMENT Provider Note   CSN: [id number] Arrival date & time: 04/12/18  2211     History   Chief Complaint Chief Complaint  Patient presents with  . Flank Pain    HPI Peter Gray. is a 62 y.o. male.  Patient with history of MI at age 86, diabetes, hyperlipidemia, hypertension, gout -- presents to the emergency department with acute onset of right sided flank pain with radiation to the right pelvis and scrotum ongoing over the past 2 days.  Symptoms have been intermittent.  They have been severe at times.  He has not had any associated fevers, nausea, vomiting, or diarrhea.  He has not had pain like this in the past but he thinks that he has a kidney stone.  No dysuria or hematuria.  The onset of this condition was acute. The course is constant. Aggravating factors: none. Alleviating factors: none.       Past Medical History:  Diagnosis Date  . Allergy   . Arthritis    joints- in general   . Coronary artery disease   . Diabetes mellitus without complication (HCC)    Hgb A1c- has lately reduced to 5.6, per management by Dr. Reynaldo Minium, has lost 30 +lbs.  . Encounter for blood transfusion    as a newborn, on two occasions had bld. transfusion  . GERD (gastroesophageal reflux disease)   . Gout   . Hepatitis   . Hyperlipidemia   . Hypertension   . Myocardial infarction ([place]) 2008  . Neuromuscular disorder ([place])    funny sensations in both feet on & off, nerves chronically traumatized with problemed feet    . Obesity   . Sleep apnea    uses CPAP- last study- long ago    Patient Active Problem List   Diagnosis Date Noted  . Obstructive sleep apnea 08/17/2015  . Umbilical hernia without obstruction and without gangrene 08/31/2014  . Hyperlipidemia   . Obesity   . Hypertension   . Coronary artery disease     Past Surgical History:  Procedure Laterality Date  . CORONARY ANGIOPLASTY WITH STENT PLACEMENT    . dental implant     . ELBOW SURGERY Right 1984   bone spur removed   . INSERTION OF MESH N/A 08/31/2014   Procedure: INSERTION OF MESH;  Surgeon: Donnie Mesa, MD;  Location: [facility];  Service: General;  Laterality: N/A;  . JOINT REPLACEMENT Left 2010   hip  . SIGMOIDOSCOPY     30 yrs ago   . UMBILICAL HERNIA REPAIR  08/31/2014   WITH [place]         DR TSUEI  . UMBILICAL HERNIA REPAIR N/A 08/31/2014   Procedure: HERNIA REPAIR UMBILICAL ADULT;  Surgeon: Donnie Mesa, MD;  Location: [facility];  Service: General;  Laterality: N/A;  . VASECTOMY          Home Medications    Prior to Admission medications   Medication Sig Start Date End Date Taking? Authorizing Provider  ANDRODERM 2 MG/24HR PT24 Apply 1 patch topically daily. 05/04/16   [provider]  aspirin 325 MG tablet Take 325 mg by mouth daily.    [provider]  atorvastatin (LIPITOR) 80 MG tablet Take 1 tablet (80 mg total) by mouth daily. 08/22/17   Martinique, Peter M, MD  cetirizine (ZYRTEC) 10 MG tablet Take 10 mg by mouth daily.    [provider]  Cholecalciferol (VITAMIN D3) 3000 UNITS TABS Take  3,000 Units by mouth.    [provider]  docusate sodium (COLACE) 100 MG capsule Take 100 mg by mouth daily.    [provider]  indomethacin (INDOCIN SR) 75 MG CR capsule Take 75 mg by mouth daily as needed for mild pain.  11/25/11   [provider]  metFORMIN (GLUCOPHAGE) 500 MG tablet Take by mouth daily with breakfast.    [provider]  metoprolol succinate (TOPROL-XL) 100 MG 24 hr tablet Take 1 tablet (100 mg total) by mouth daily. Take with or immediately following a meal. 08/22/17   Martinique, Peter M, MD  Multiple Vitamin (MULTIVITAMIN) tablet Take 1 tablet by mouth daily.    [provider]  nitroGLYCERIN (NITROSTAT) 0.4 MG SL tablet Place 1 tablet (0.4 mg total) under the tongue every 5 (five) minutes as needed for chest pain. 02/02/18   Lendon Colonel, NP  omega-3 acid ethyl  esters (LOVAZA) 1 g capsule Take 4 capsules (4 g total) by mouth daily. 08/22/17   Martinique, Peter M, MD  ramipril (ALTACE) 2.5 MG capsule Take 1 capsule (2.5 mg total) by mouth daily. 08/22/17   Martinique, Peter M, MD    Family History Family History  Problem Relation Age of Onset  . Coronary artery disease Mother   . Breast cancer Mother   . Other Father   . Coronary artery disease Father   . Heart failure Father   . Colon cancer Neg Hx   . Colon polyps Neg Hx     Social History Social History   Tobacco Use  . Smoking status: Never Smoker  . Smokeless tobacco: Never Used  Substance Use Topics  . Alcohol use: Yes    Comment: occas,- less than social   . Drug use: No     Allergies   Bee venom   Review of Systems Review of Systems  Constitutional: Negative for fever.  HENT: Negative for rhinorrhea and sore throat.   Eyes: Negative for redness.  Respiratory: Negative for cough and shortness of breath.   Cardiovascular: Negative for chest pain.  Gastrointestinal: Positive for abdominal pain. Negative for diarrhea, nausea and vomiting.  Genitourinary: Positive for flank pain. Negative for dysuria and scrotal swelling.  Musculoskeletal: Negative for myalgias.  Skin: Negative for rash.  Neurological: Negative for headaches.     Physical Exam Updated Vital Signs BP (!) 152/68 (BP Location: Right Arm)   Pulse 62   Temp 98.4 F (36.9 C) (Oral)   Resp 18   SpO2 100%   Physical Exam  Constitutional: He appears well-developed and well-nourished.  HENT:  Head: Normocephalic and atraumatic.  Mouth/Throat: Oropharynx is clear and moist.  Eyes: Conjunctivae are normal. Right eye exhibits no discharge. Left eye exhibits no discharge.  Neck: Normal range of motion. Neck supple.  Cardiovascular: Normal rate, regular rhythm and normal heart sounds.  Pulmonary/Chest: Effort normal and breath sounds normal. No respiratory distress. He has no wheezes. He has no rales.    Abdominal: Soft. There is no tenderness. There is no rebound and no guarding.  Genitourinary: Testes normal. Right testis shows no mass, no swelling and no tenderness. Left testis shows no mass, no swelling and no tenderness. Circumcised.  Neurological: He is alert.  Skin: Skin is warm and dry.  Psychiatric: He has a normal mood and affect.  Nursing note and vitals reviewed.    ED Treatments / Results  Labs (all labs ordered are listed, but only abnormal results are displayed) Labs Reviewed  CBC WITH DIFFERENTIAL/PLATELET - Abnormal; Notable for the following components:      Result Value   WBC 12.0 (*)    Neutro Abs 8.8 (*)    All other components within normal limits  BASIC METABOLIC PANEL - Abnormal; Notable for the following components:   Sodium 132 (*)    Chloride 98 (*)    Glucose, Bld 101 (*)    BUN 28 (*)    Creatinine, Ser 2.24 (*)    GFR calc non Af Amer 30 (*)    GFR calc Af Amer 35 (*)    All other components within normal limits  URINALYSIS, ROUTINE W REFLEX MICROSCOPIC - Abnormal; Notable for the following components:   Color, Urine COLORLESS (*)    Specific Gravity, Urine 1.003 (*)    Hgb urine dipstick SMALL (*)    All other components within normal limits    EKG None  Radiology Ct Renal Stone Study  Result Date: 04/12/2018 CLINICAL DATA:  Acute onset of right flank and right groin pain. EXAM: CT ABDOMEN AND PELVIS WITHOUT CONTRAST TECHNIQUE: Multidetector CT imaging of the abdomen and pelvis was performed following the standard protocol without IV contrast. COMPARISON:  Pelvic radiograph performed 09/04/2009, and abdominal ultrasound performed 02/13/2007 FINDINGS: Lower chest: The visualized lung bases are grossly clear. Mild coronary artery calcifications are seen. Hepatobiliary: The liver is unremarkable in appearance. The gallbladder is unremarkable in appearance. The common bile duct remains normal in caliber. Pancreas: The pancreas is within normal  limits. Spleen: The spleen is unremarkable in appearance. Adrenals/Urinary Tract: The adrenal glands are unremarkable in appearance. Mild right-sided hydronephrosis is noted, with an obstructing 4 mm stone noted distally at the right vesicoureteral junction. Right-sided perinephric stranding and fluid are seen. Nonspecific left-sided perinephric stranding is noted. A 3 mm nonobstructing right renal stone is noted. Stomach/Bowel: The stomach is unremarkable in appearance. The small bowel is within normal limits. The appendix is normal in caliber, without evidence of appendicitis. Mild scattered diverticulosis is noted along the descending and sigmoid colon, without evidence of diverticulitis. Vascular/Lymphatic: Scattered calcification is seen along the abdominal aorta and its branches. A circumaortic left renal vein is noted. The inferior vena cava is grossly unremarkable. No retroperitoneal lymphadenopathy is seen. No pelvic sidewall lymphadenopathy is identified. Reproductive: The bladder is mildly distended and otherwise unremarkable. The prostate remains normal in size. Other: No additional soft tissue abnormalities are seen. Musculoskeletal: No acute osseous abnormalities are identified. There is minimal grade 1 anterolisthesis of L4 on L5, reflecting underlying facet disease. The patient's left hip arthroplasty is incompletely imaged, but appears grossly unremarkable. The visualized musculature is unremarkable in appearance. IMPRESSION: 1. Mild right-sided hydronephrosis, with an obstructing 4 mm stone noted distally at the right vesicoureteral junction. 2. 3 mm nonobstructing right renal stone noted. 3. Mild scattered diverticulosis along the descending and sigmoid colon, without evidence of diverticulitis. 4. Mild coronary artery calcifications seen. Aortic Atherosclerosis (ICD10-I70.0). Electronically Signed   By: Garald Balding M.D.   On: 04/12/2018 23:53    Procedures Procedures (including critical  care time)  Medications Ordered in ED Medications  ketorolac (TORADOL) 15 MG/ML injection 15 mg (15 mg Intravenous Given 04/13/18 0022)  fentaNYL (SUBLIMAZE) injection 100 mcg (100 mcg Intravenous Given 04/13/18 0131)  ondansetron (ZOFRAN) injection 4 mg (4 mg Intravenous Given 04/13/18 0129)  sodium chloride 0.9 % bolus 500 mL (0 mLs Intravenous Stopped 04/13/18 0310)     Initial Impression / Assessment and Plan / ED Course  I have reviewed the triage vital signs and the nursing notes.  Pertinent labs & imaging results that were available during my care of the patient were reviewed by me and considered in my medical decision making (see chart for details).  Clinical Course as of Apr 13 1799  Mon Apr 13, 2018  0030 Elevated.  Baseline 1.2  Creatinine(!): 2.24 [HM]  0052 Plan: Pain control and reassess   [HM]  0100 Noted, likely pain induced  BP(!): 152/68 [HM]  0138 Discussed with D. Niu who will admit.  Will consult with urology as well   [HM]  0139 Mild.  No fever or signs of UTI  WBC(!): 12.0 [HM]  0240 Discussed with Dr. Gloriann Loan of urology who will evaluate in the AM   [HM]    Clinical Course User Index [HM] Muthersbaugh, Jarrett Soho, PA-C    Patient seen and examined. Work-up initiated. Medications ordered.   Vital signs reviewed and are as follows: BP (!) 152/68 (BP Location: Right Arm)   Pulse 62   Temp 98.4 F (36.9 C) (Oral)   Resp 18   SpO2 100%   12:25 AM CT scan reviewed with patient and wife at bedside. His pain is worsening. Toradol ordered. Will attempt to minimize use based on previous CAD history.   Anticipate d/c home with percocet, zofran, Flomax when symptoms controlled.   1:04 AM BMP returned showing AKI. Patient does not have a history of elevated creatinine. Pt is on metformin and ramipril.   Discussed with Muthersbaugh PA-C who will follow-up, admit for treatment.   Final Clinical Impressions(s) / ED Diagnoses   Final diagnoses:  Ureteral colic  Acute  kidney injury [facility])   Pending completion of treatment.    ED Discharge Orders    None       Carlisle Cater, PA-C 04/13/18 0114    Carlisle Cater, PA-C 04/13/18 1800    Leonette Monarch Grayce Sessions, MD 04/15/18 [id number]

## 2018-04-13 DIAGNOSIS — N23 Unspecified renal colic: Secondary | ICD-10-CM | POA: Diagnosis not present

## 2018-04-13 DIAGNOSIS — E1122 Type 2 diabetes mellitus with diabetic chronic kidney disease: Secondary | ICD-10-CM | POA: Diagnosis not present

## 2018-04-13 DIAGNOSIS — N183 Chronic kidney disease, stage 3 unspecified: Secondary | ICD-10-CM | POA: Diagnosis present

## 2018-04-13 DIAGNOSIS — M109 Gout, unspecified: Secondary | ICD-10-CM | POA: Diagnosis present

## 2018-04-13 DIAGNOSIS — G4733 Obstructive sleep apnea (adult) (pediatric): Secondary | ICD-10-CM

## 2018-04-13 DIAGNOSIS — N179 Acute kidney failure, unspecified: Secondary | ICD-10-CM | POA: Diagnosis present

## 2018-04-13 DIAGNOSIS — E785 Hyperlipidemia, unspecified: Secondary | ICD-10-CM | POA: Diagnosis not present

## 2018-04-13 DIAGNOSIS — N201 Calculus of ureter: Secondary | ICD-10-CM | POA: Diagnosis present

## 2018-04-13 DIAGNOSIS — K219 Gastro-esophageal reflux disease without esophagitis: Secondary | ICD-10-CM

## 2018-04-13 DIAGNOSIS — E1129 Type 2 diabetes mellitus with other diabetic kidney complication: Secondary | ICD-10-CM | POA: Diagnosis present

## 2018-04-13 DIAGNOSIS — N1330 Unspecified hydronephrosis: Secondary | ICD-10-CM | POA: Diagnosis not present

## 2018-04-13 DIAGNOSIS — R109 Unspecified abdominal pain: Secondary | ICD-10-CM | POA: Diagnosis not present

## 2018-04-13 DIAGNOSIS — I2510 Atherosclerotic heart disease of native coronary artery without angina pectoris: Secondary | ICD-10-CM | POA: Diagnosis not present

## 2018-04-13 DIAGNOSIS — N202 Calculus of kidney with calculus of ureter: Secondary | ICD-10-CM | POA: Diagnosis not present

## 2018-04-13 DIAGNOSIS — N200 Calculus of kidney: Secondary | ICD-10-CM

## 2018-04-13 LAB — CBC
HEMATOCRIT: 39.4 % (ref 39.0–52.0)
Hemoglobin: 13.1 g/dL (ref 13.0–17.0)
MCH: 30 pg (ref 26.0–34.0)
MCHC: 33.2 g/dL (ref 30.0–36.0)
MCV: 90.4 fL (ref 78.0–100.0)
Platelets: 212 10*3/uL (ref 150–400)
RBC: 4.36 MIL/uL (ref 4.22–5.81)
RDW: 12.4 % (ref 11.5–15.5)
WBC: 9.7 10*3/uL (ref 4.0–10.5)

## 2018-04-13 LAB — HIV ANTIBODY (ROUTINE TESTING W REFLEX): HIV SCREEN 4TH GENERATION: NONREACTIVE

## 2018-04-13 LAB — BASIC METABOLIC PANEL
Anion gap: 11 (ref 5–15)
Anion gap: 6 (ref 5–15)
BUN: 28 mg/dL — AB (ref 6–20)
BUN: 25 mg/dL — AB (ref 6–20)
CHLORIDE: 107 mmol/L (ref 101–111)
CO2: 23 mmol/L (ref 22–32)
CO2: 25 mmol/L (ref 22–32)
CREATININE: 2.24 mg/dL — AB (ref 0.61–1.24)
Calcium: 8.9 mg/dL (ref 8.9–10.3)
Calcium: 8.8 mg/dL — ABNORMAL LOW (ref 8.9–10.3)
Chloride: 98 mmol/L — ABNORMAL LOW (ref 101–111)
Creatinine, Ser: 2.02 mg/dL — ABNORMAL HIGH (ref 0.61–1.24)
GFR calc Af Amer: 35 mL/min — ABNORMAL LOW (ref >60)
GFR calc Af Amer: 39 mL/min — ABNORMAL LOW (ref >60)
GFR, EST NON AFRICAN AMERICAN: 30 mL/min — AB (ref >60)
GFR, EST NON AFRICAN AMERICAN: 34 mL/min — AB (ref >60)
GLUCOSE: 110 mg/dL — AB (ref 65–99)
Glucose, Bld: 101 mg/dL — ABNORMAL HIGH (ref 65–99)
Potassium: 4.6 mmol/L (ref 3.5–5.1)
Potassium: 4.9 mmol/L (ref 3.5–5.1)
SODIUM: 132 mmol/L — AB (ref 135–145)
Sodium: 138 mmol/L (ref 135–145)

## 2018-04-13 LAB — CBC WITH DIFFERENTIAL/PLATELET
Abs Immature Granulocytes: 0.1 10*3/uL (ref 0.0–0.1)
BASOS ABS: 0 10*3/uL (ref 0.0–0.1)
BASOS PCT: 0 %
EOS PCT: 1 %
Eosinophils Absolute: 0.2 10*3/uL (ref 0.0–0.7)
HCT: 41.9 % (ref 39.0–52.0)
Hemoglobin: 14 g/dL (ref 13.0–17.0)
Immature Granulocytes: 0 %
Lymphocytes Relative: 18 %
Lymphs Abs: 2.1 10*3/uL (ref 0.7–4.0)
MCH: 30.5 pg (ref 26.0–34.0)
MCHC: 33.4 g/dL (ref 30.0–36.0)
MCV: 91.3 fL (ref 78.0–100.0)
MONO ABS: 0.8 10*3/uL (ref 0.1–1.0)
Monocytes Relative: 7 %
NEUTROS ABS: 8.8 10*3/uL — AB (ref 1.7–7.7)
Neutrophils Relative %: 74 %
PLATELETS: 225 10*3/uL (ref 150–400)
RBC: 4.59 MIL/uL (ref 4.22–5.81)
RDW: 12.7 % (ref 11.5–15.5)
WBC: 12 10*3/uL — ABNORMAL HIGH (ref 4.0–10.5)

## 2018-04-13 LAB — GLUCOSE, CAPILLARY
Glucose-Capillary: 102 mg/dL — ABNORMAL HIGH (ref 65–99)
Glucose-Capillary: 92 mg/dL (ref 65–99)

## 2018-04-13 LAB — PROTIME-INR
INR: 1.09
Prothrombin Time: 14 seconds (ref 11.4–15.2)

## 2018-04-13 LAB — APTT: aPTT: 34 seconds (ref 24–36)

## 2018-04-13 MED ORDER — KETOROLAC TROMETHAMINE 15 MG/ML IJ SOLN
15.0000 mg | Freq: Once | INTRAMUSCULAR | Status: AC
  Administered 2018-04-13: 15 mg via INTRAVENOUS
  Filled 2018-04-13: qty 1

## 2018-04-13 MED ORDER — ACETAMINOPHEN 650 MG RE SUPP: 650.0000 mg | Freq: Four times a day (QID) | RECTAL | Status: DC | PRN

## 2018-04-13 MED ORDER — MORPHINE SULFATE (PF) 4 MG/ML IV SOLN
2.0000 mg | INTRAVENOUS | Status: DC | PRN
  Administered 2018-04-13: 2 mg via INTRAVENOUS
  Filled 2018-04-13: qty 1

## 2018-04-13 MED ORDER — ONDANSETRON 4 MG PO TBDP: 4.0000 mg | ORAL_TABLET | Freq: Three times a day (TID) | ORAL | 0 refills | Status: DC | PRN

## 2018-04-13 MED ORDER — TAMSULOSIN HCL 0.4 MG PO CAPS: 0.4000 mg | ORAL_CAPSULE | Freq: Every day | ORAL | 0 refills | Status: DC

## 2018-04-13 MED ORDER — SODIUM CHLORIDE 0.9 % IV BOLUS
500.0000 mL | Freq: Once | INTRAVENOUS | Status: AC
  Administered 2018-04-13: 500 mL via INTRAVENOUS

## 2018-04-13 MED ORDER — SODIUM CHLORIDE 0.9 % IV SOLN
INTRAVENOUS | Status: DC
  Administered 2018-04-13: 03:00:00 via INTRAVENOUS

## 2018-04-13 MED ORDER — OXYCODONE-ACETAMINOPHEN 5-325 MG PO TABS: 1.0000 | ORAL_TABLET | ORAL | Status: DC | PRN

## 2018-04-13 MED ORDER — FAMOTIDINE 20 MG PO TABS: 20.0000 mg | ORAL_TABLET | Freq: Two times a day (BID) | ORAL | Status: DC

## 2018-04-13 MED ORDER — SENNOSIDES-DOCUSATE SODIUM 8.6-50 MG PO TABS: 1.0000 | ORAL_TABLET | Freq: Every evening | ORAL | Status: DC | PRN

## 2018-04-13 MED ORDER — FENTANYL CITRATE (PF) 100 MCG/2ML IJ SOLN
100.0000 ug | Freq: Once | INTRAMUSCULAR | Status: AC
  Administered 2018-04-13: 100 ug via INTRAVENOUS
  Filled 2018-04-13: qty 2

## 2018-04-13 MED ORDER — ADULT MULTIVITAMIN W/MINERALS CH: 1.0000 | ORAL_TABLET | Freq: Every day | ORAL | Status: DC

## 2018-04-13 MED ORDER — METOPROLOL SUCCINATE ER 100 MG PO TB24: 100.0000 mg | ORAL_TABLET | Freq: Every day | ORAL | Status: DC

## 2018-04-13 MED ORDER — ATORVASTATIN CALCIUM 80 MG PO TABS: 80.0000 mg | ORAL_TABLET | Freq: Every day | ORAL | Status: DC

## 2018-04-13 MED ORDER — OMEGA-3-ACID ETHYL ESTERS 1 G PO CAPS: 4.0000 | ORAL_CAPSULE | Freq: Every day | ORAL | Status: DC

## 2018-04-13 MED ORDER — TAMSULOSIN HCL 0.4 MG PO CAPS
0.4000 mg | ORAL_CAPSULE | Freq: Every day | ORAL | Status: DC
  Filled 2018-04-13: qty 1

## 2018-04-13 MED ORDER — ONDANSETRON HCL 4 MG/2ML IJ SOLN: 4.0000 mg | Freq: Three times a day (TID) | INTRAMUSCULAR | Status: DC | PRN

## 2018-04-13 MED ORDER — NITROGLYCERIN 0.4 MG SL SUBL: 0.4000 mg | SUBLINGUAL_TABLET | SUBLINGUAL | Status: DC | PRN

## 2018-04-13 MED ORDER — ASPIRIN EC 81 MG PO TBEC: 81.0000 mg | DELAYED_RELEASE_TABLET | Freq: Every day | ORAL | Status: DC

## 2018-04-13 MED ORDER — KETOROLAC TROMETHAMINE 15 MG/ML IJ SOLN: 15.0000 mg | Freq: Once | INTRAMUSCULAR | Status: DC

## 2018-04-13 MED ORDER — LORATADINE 10 MG PO TABS: 10.0000 mg | ORAL_TABLET | Freq: Every day | ORAL | Status: DC

## 2018-04-13 MED ORDER — OXYCODONE-ACETAMINOPHEN 5-325 MG PO TABS: 1.0000 | ORAL_TABLET | Freq: Four times a day (QID) | ORAL | 0 refills | Status: DC | PRN

## 2018-04-13 MED ORDER — HYDRALAZINE HCL 20 MG/ML IJ SOLN: 5.0000 mg | INTRAMUSCULAR | Status: DC | PRN

## 2018-04-13 MED ORDER — INSULIN ASPART 100 UNIT/ML ~~LOC~~ SOLN: 0.0000 [IU] | Freq: Three times a day (TID) | SUBCUTANEOUS | Status: DC

## 2018-04-13 MED ORDER — ZOLPIDEM TARTRATE 5 MG PO TABS: 5.0000 mg | ORAL_TABLET | Freq: Every evening | ORAL | Status: DC | PRN

## 2018-04-13 MED ORDER — ACETAMINOPHEN 325 MG PO TABS: 650.0000 mg | ORAL_TABLET | Freq: Four times a day (QID) | ORAL | Status: DC | PRN

## 2018-04-13 MED ORDER — INSULIN ASPART 100 UNIT/ML ~~LOC~~ SOLN: 0.0000 [IU] | Freq: Every day | SUBCUTANEOUS | Status: DC

## 2018-04-13 MED ORDER — ONDANSETRON HCL 4 MG/2ML IJ SOLN
4.0000 mg | Freq: Once | INTRAMUSCULAR | Status: AC
  Administered 2018-04-13: 4 mg via INTRAVENOUS
  Filled 2018-04-13: qty 2

## 2018-04-13 NOTE — Consult Note (Addendum)
Patient passed stone o/n. Pain resolved. Cr improved small amount already  No surgical intervention. DC at discretion of hospitalist but OK from my standpoint.  Full note to follow later. I put him on a diabetic/heart healthy diet  H&P Physician requesting consult: Ivor Costa, MD  Chief Complaint: Right ureteral calculus  History of Present Illness: 62 year old male presented with a 4 mm right ureterovesicular junction calculus with associated acute renal insufficiency with a creatinine of 2.2.  He was admitted to the hospitalist service.  Around 3 AM this morning, the patient passed his stone.  I personally saw it in a basin.  Pain has resolved.  Creatinine has slightly improved to a value of 2.  No other obstructing calculi seen on CT scan.  He did have a smaller 3 mm nonobstructing right renal calculus as well.  Patient denies any fever, chill, nausea, vomiting.  He feels well this morning.  Past Medical History:  Diagnosis Date  . Allergy   . Arthritis    joints- in general   . Coronary artery disease   . Diabetes mellitus without complication (HCC)    Hgb A1c- has lately reduced to 5.6, per management by Dr. Reynaldo Minium, has lost 30 +lbs.  . Encounter for blood transfusion    as a newborn, on two occasions had bld. transfusion  . GERD (gastroesophageal reflux disease)   . Gout   . Hepatitis   . Hyperlipidemia   . Hypertension   . Myocardial infarction ([place]) 2008  . Neuromuscular disorder ([place])    funny sensations in both feet on & off, nerves chronically traumatized with problemed feet    . Obesity   . Sleep apnea    uses CPAP- last study- long ago   Past Surgical History:  Procedure Laterality Date  . CORONARY ANGIOPLASTY WITH STENT PLACEMENT    . dental implant    . ELBOW SURGERY Right 1984   bone spur removed   . INSERTION OF MESH N/A 08/31/2014   Procedure: INSERTION OF MESH;  Surgeon: Donnie Mesa, MD;  Location: [facility];  Service: General;  Laterality: N/A;  . JOINT  REPLACEMENT Left 2010   hip  . SIGMOIDOSCOPY     30 yrs ago   . UMBILICAL HERNIA REPAIR  08/31/2014   WITH [place]         DR TSUEI  . UMBILICAL HERNIA REPAIR N/A 08/31/2014   Procedure: HERNIA REPAIR UMBILICAL ADULT;  Surgeon: Donnie Mesa, MD;  Location: [facility];  Service: General;  Laterality: N/A;  . VASECTOMY      Home Medications:  Facility-Administered Medications Prior to Admission  Medication Dose Route Frequency Provider Last Rate Last Dose  . 0.9 %  sodium chloride infusion  500 mL Intravenous Continuous Irene Shipper, MD       Medications Prior to Admission  Medication Sig Dispense Refill Last Dose  . aspirin EC 81 MG tablet Take 81 mg by mouth daily.   04/12/2018 at Unknown time  . atorvastatin (LIPITOR) 80 MG tablet Take 1 tablet (80 mg total) by mouth daily. 90 tablet 3 04/12/2018 at Unknown time  . cetirizine (ZYRTEC) 10 MG tablet Take 10 mg by mouth at bedtime.    04/12/2018 at Unknown time  . famotidine (PEPCID) 20 MG tablet Take 20 mg by mouth 2 (two) times daily.   04/12/2018 at Unknown time  . indomethacin (INDOCIN SR) 75 MG CR capsule Take 75 mg by mouth 3 (three) times daily as needed for mild  pain.    04/12/2018 at Unknown time  . metFORMIN (GLUCOPHAGE) 500 MG tablet Take 500 mg by mouth 2 (two) times daily with a meal.    04/12/2018 at Unknown time  . metoprolol succinate (TOPROL-XL) 100 MG 24 hr tablet Take 1 tablet (100 mg total) by mouth daily. Take with or immediately following a meal. 90 tablet 3 04/12/2018 at 0800  . Multiple Vitamin (MULTIVITAMIN) tablet Take 1 tablet by mouth daily.   04/12/2018 at Unknown time  . nitroGLYCERIN (NITROSTAT) 0.4 MG SL tablet Place 1 tablet (0.4 mg total) under the tongue every 5 (five) minutes as needed for chest pain. 25 tablet 1 unknown  . omega-3 acid ethyl esters (LOVAZA) 1 g capsule Take 4 capsules (4 g total) by mouth daily. 360 capsule 3 04/12/2018 at Unknown time  . ramipril (ALTACE) 2.5 MG capsule Take 1 capsule (2.5 mg total) by mouth  daily. 90 capsule 3 04/12/2018 at Unknown time  . TRULICITY 1.5 WL/7.9GX SOPN Inject 1.5 mg into the skin once a week. On Sunday  6 04/12/2018 at Unknown time   Allergies:  Allergies  Allergen Reactions  . Bee Venom Swelling    Family History  Problem Relation Age of Onset  . Coronary artery disease Mother   . Breast cancer Mother   . Other Father   . Coronary artery disease Father   . Heart failure Father   . Colon cancer Neg Hx   . Colon polyps Neg Hx    Social History:  reports that he has never smoked. He has never used smokeless tobacco. He reports that he drinks alcohol. He reports that he does not use drugs.  ROS: A complete review of systems was performed.  All systems are negative except for pertinent findings as noted. ROS   Physical Exam:  Vital signs in last 24 hours: Temp:  [98 F (36.7 C)-98.4 F (36.9 C)] 98 F (36.7 C) (06/03 0303) Pulse Rate:  [57-62] 57 (06/03 0303) Resp:  [16-18] 17 (06/03 0303) BP: (116-152)/(67-70) 116/70 (06/03 0303) SpO2:  [96 %-100 %] 98 % (06/03 0303) General:  Alert and oriented, No acute distress HEENT: Normocephalic, atraumatic Neck: No JVD or lymphadenopathy Cardiovascular: Regular rate and rhythm Lungs: Regular rate and effort Abdomen: Soft, nontender, nondistended, no abdominal masses Back: No CVA tenderness Extremities: No edema Neurologic: Grossly intact  Laboratory Data:  Results for orders placed or performed during the hospital encounter of 04/12/18 (from the past 24 hour(s))  Urinalysis, Routine w reflex microscopic     Status: Abnormal   Collection Time: 04/12/18 11:04 PM  Result Value Ref Range   Color, Urine COLORLESS (A) YELLOW   APPearance CLEAR CLEAR   Specific Gravity, Urine 1.003 (L) 1.005 - 1.030   pH 5.0 5.0 - 8.0   Glucose, UA NEGATIVE NEGATIVE mg/dL   Hgb urine dipstick SMALL (A) NEGATIVE   Bilirubin Urine NEGATIVE NEGATIVE   Ketones, ur NEGATIVE NEGATIVE mg/dL   Protein, ur NEGATIVE NEGATIVE  mg/dL   Nitrite NEGATIVE NEGATIVE   Leukocytes, UA NEGATIVE NEGATIVE   RBC / HPF 0-5 0 - 5 RBC/hpf   WBC, UA 0-5 0 - 5 WBC/hpf   Bacteria, UA NONE SEEN NONE SEEN  CBC with Differential/Platelet     Status: Abnormal   Collection Time: 04/12/18 11:17 PM  Result Value Ref Range   WBC 12.0 (H) 4.0 - 10.5 K/uL   RBC 4.59 4.22 - 5.81 MIL/uL   Hemoglobin 14.0 13.0 - 17.0 g/dL  HCT 41.9 39.0 - 52.0 %   MCV 91.3 78.0 - 100.0 fL   MCH 30.5 26.0 - 34.0 pg   MCHC 33.4 30.0 - 36.0 g/dL   RDW 12.7 11.5 - 15.5 %   Platelets 225 150 - 400 K/uL   Neutrophils Relative % 74 %   Neutro Abs 8.8 (H) 1.7 - 7.7 K/uL   Lymphocytes Relative 18 %   Lymphs Abs 2.1 0.7 - 4.0 K/uL   Monocytes Relative 7 %   Monocytes Absolute 0.8 0.1 - 1.0 K/uL   Eosinophils Relative 1 %   Eosinophils Absolute 0.2 0.0 - 0.7 K/uL   Basophils Relative 0 %   Basophils Absolute 0.0 0.0 - 0.1 K/uL   Immature Granulocytes 0 %   Abs Immature Granulocytes 0.1 0.0 - 0.1 K/uL  Basic metabolic panel     Status: Abnormal   Collection Time: 04/12/18 11:17 PM  Result Value Ref Range   Sodium 132 (L) 135 - 145 mmol/L   Potassium 4.6 3.5 - 5.1 mmol/L   Chloride 98 (L) 101 - 111 mmol/L   CO2 23 22 - 32 mmol/L   Glucose, Bld 101 (H) 65 - 99 mg/dL   BUN 28 (H) 6 - 20 mg/dL   Creatinine, Ser 2.24 (H) 0.61 - 1.24 mg/dL   Calcium 8.9 8.9 - 10.3 mg/dL   GFR calc non Af Amer 30 (L) >60 mL/min   GFR calc Af Amer 35 (L) >60 mL/min   Anion gap 11 5 - 15  Glucose, capillary     Status: Abnormal   Collection Time: 04/13/18  3:05 AM  Result Value Ref Range   Glucose-Capillary 102 (H) 65 - 99 mg/dL  Protime-INR     Status: None   Collection Time: 04/13/18  4:31 AM  Result Value Ref Range   Prothrombin Time 14.0 11.4 - 15.2 seconds   INR 1.09   APTT     Status: None   Collection Time: 04/13/18  4:31 AM  Result Value Ref Range   aPTT 34 24 - 36 seconds  Basic metabolic panel     Status: Abnormal   Collection Time: 04/13/18  4:31 AM   Result Value Ref Range   Sodium 138 135 - 145 mmol/L   Potassium 4.9 3.5 - 5.1 mmol/L   Chloride 107 101 - 111 mmol/L   CO2 25 22 - 32 mmol/L   Glucose, Bld 110 (H) 65 - 99 mg/dL   BUN 25 (H) 6 - 20 mg/dL   Creatinine, Ser 2.02 (H) 0.61 - 1.24 mg/dL   Calcium 8.8 (L) 8.9 - 10.3 mg/dL   GFR calc non Af Amer 34 (L) >60 mL/min   GFR calc Af Amer 39 (L) >60 mL/min   Anion gap 6 5 - 15  CBC     Status: None   Collection Time: 04/13/18  4:31 AM  Result Value Ref Range   WBC 9.7 4.0 - 10.5 K/uL   RBC 4.36 4.22 - 5.81 MIL/uL   Hemoglobin 13.1 13.0 - 17.0 g/dL   HCT 39.4 39.0 - 52.0 %   MCV 90.4 78.0 - 100.0 fL   MCH 30.0 26.0 - 34.0 pg   MCHC 33.2 30.0 - 36.0 g/dL   RDW 12.4 11.5 - 15.5 %   Platelets 212 150 - 400 K/uL   No results found for this or any previous visit (from the past 240 hour(s)). Creatinine: Recent Labs    04/12/18 2317 04/13/18 0431  CREATININE  2.24* 2.02*    Impression/Assessment:  Right ureteral and renal calculus Acute renal insufficiency  Plan:  The patient has passed his stone.  No urological intervention necessary.  Agree with current management with conservative IV fluids.  I ordered a diet for him.  He can follow-up outpatient nonurgently.  Marton Redwood, III 04/13/2018, 7:18 AM

## 2018-04-13 NOTE — H&P (Signed)
History and Physical    Peter Gray. VOZ:[id number] DOB: 1956-04-12 DOA: 04/12/2018  Referring MD/NP/PA:   PCP: [name], MD   Patient coming from:  The patient is coming from home.  At baseline, pt is independent for most of ADL.   Chief Complaint: right flank pain  HPI: Peter Gray. is a 62 y.o. male with medical history significant of hypertension, hyperlipidemia, diabetes mellitus, GERD, gout, CAD, stent placement, OSA, obesity, CKD 3, who presents with right flank pain.  Patient states that he has been having intermittent right flank pain for 3 days.  The pain is sharp, radiating to the right lower abdomen, moderate.  He had mild nausea, which has resolved, currently no nausea, vomiting, diarrhea or abdominal pain.  No fever or chills.  No hematuria, dysuria or burning on urination.  Patient denies chest pain, shortness breath, cough.  No unilateral weakness.  No history of kidney stones.  ED Course: pt was found to have WBC 12.0, negative urinalysis, worsening renal function, temperature normal, no tachycardia, no tachypnea, oxygen saturation 100% on room air.  Patient is admitted to [place] bed as inpatient.  # CT per renal stone protocol showed  1. Mild right-sided hydronephrosis, with an obstructing 4 mm stone noted distally at the right vesicoureteral junction. 2. 3 mm nonobstructing right renal stone noted. 3. Mild scattered diverticulosis along the descending and sigmoid colon, without evidence of diverticulitis. 4. Mild coronary artery calcifications seen.    Review of Systems:   General: no fevers, chills, no body weight gain, has fatigue HEENT: no blurry vision, hearing changes or sore throat Respiratory: no dyspnea, coughing, wheezing CV: no chest pain, no palpitations GI: has nausea, no vomiting, abdominal pain, diarrhea, constipation GU: no dysuria, burning on urination, increased urinary frequency, hematuria . Has right flank pain. Ext: no leg  edema Neuro: no unilateral weakness, numbness, or tingling, no vision change or hearing loss Skin: no rash, no skin tear. MSK: No muscle spasm, no deformity, no limitation of range of movement in spin Heme: No easy bruising.  Travel history: No recent long distant travel.  Allergy:  Allergies  Allergen Reactions  . Bee Venom Swelling    Past Medical History:  Diagnosis Date  . Allergy   . Arthritis    joints- in general   . Coronary artery disease   . Diabetes mellitus without complication (HCC)    Hgb A1c- has lately reduced to 5.6, per management by Dr. Reynaldo Minium, has lost 30 +lbs.  . Encounter for blood transfusion    as a newborn, on two occasions had bld. transfusion  . GERD (gastroesophageal reflux disease)   . Gout   . Hepatitis   . Hyperlipidemia   . Hypertension   . Myocardial infarction ([place]) 2008  . Neuromuscular disorder ([place])    funny sensations in both feet on & off, nerves chronically traumatized with problemed feet    . Obesity   . Sleep apnea    uses CPAP- last study- long ago    Past Surgical History:  Procedure Laterality Date  . CORONARY ANGIOPLASTY WITH STENT PLACEMENT    . dental implant    . ELBOW SURGERY Right 1984   bone spur removed   . INSERTION OF MESH N/A 08/31/2014   Procedure: INSERTION OF MESH;  Surgeon: Donnie Mesa, MD;  Location: [facility];  Service: General;  Laterality: N/A;  . JOINT REPLACEMENT Left 2010   hip  . SIGMOIDOSCOPY  30 yrs ago   . UMBILICAL HERNIA REPAIR  08/31/2014   WITH [facility]         DR [email or web address]  . UMBILICAL HERNIA REPAIR N/A 08/31/2014   Procedure: HERNIA REPAIR UMBILICAL ADULT;  Surgeon: Donnie Mesa, MD;  Location: [facility];  Service: General;  Laterality: N/A;  . VASECTOMY      Social History:  reports that he has never smoked. He has never used smokeless tobacco. He reports that he drinks alcohol. He reports that he does not use drugs.  Family History:  Family History  Problem Relation Age of Onset  .  Coronary artery disease Mother   . Breast cancer Mother   . Other Father   . Coronary artery disease Father   . Heart failure Father   . Colon cancer Neg Hx   . Colon polyps Neg Hx      Prior to Admission medications   Medication Sig Start Date End Date Taking? Authorizing Provider  aspirin EC 81 MG tablet Take 81 mg by mouth daily.   Yes [provider]  atorvastatin (LIPITOR) 80 MG tablet Take 1 tablet (80 mg total) by mouth daily. 08/22/17  Yes [place], Peter M, MD  cetirizine (ZYRTEC) 10 MG tablet Take 10 mg by mouth at bedtime.    Yes [provider]  famotidine (PEPCID) 20 MG tablet Take 20 mg by mouth 2 (two) times daily.   Yes [provider]  indomethacin (INDOCIN SR) 75 MG CR capsule Take 75 mg by mouth 3 (three) times daily as needed for mild pain.  11/25/11  Yes [provider]  metFORMIN (GLUCOPHAGE) 500 MG tablet Take 500 mg by mouth 2 (two) times daily with a meal.    Yes [provider]  metoprolol succinate (TOPROL-XL) 100 MG 24 hr tablet Take 1 tablet (100 mg total) by mouth daily. Take with or immediately following a meal. 08/22/17  Yes [place], Peter M, MD  Multiple Vitamin (MULTIVITAMIN) tablet Take 1 tablet by mouth daily.   Yes [provider]  nitroGLYCERIN (NITROSTAT) 0.4 MG SL tablet Place 1 tablet (0.4 mg total) under the tongue every 5 (five) minutes as needed for chest pain. 02/02/18  Yes Lendon Colonel, NP  omega-3 acid ethyl esters (LOVAZA) 1 g capsule Take 4 capsules (4 g total) by mouth daily. 08/22/17  Yes [place], Peter M, MD  ramipril (ALTACE) 2.5 MG capsule Take 1 capsule (2.5 mg total) by mouth daily. 08/22/17  Yes [place], Peter M, MD  TRULICITY 1.5 ZS/0.1UX SOPN Inject 1.5 mg into the skin once a week. On Sunday 03/16/18  Yes [provider]  ondansetron (ZOFRAN ODT) 4 MG disintegrating tablet Take 1 tablet (4 mg total) by mouth every 8 (eight) hours as needed for nausea or vomiting. 04/13/18    Carlisle Cater, PA-C  oxyCODONE-acetaminophen (PERCOCET/ROXICET) 5-325 MG tablet Take 1-2 tablets by mouth every 6 (six) hours as needed for severe pain. 04/13/18   Carlisle Cater, PA-C  tamsulosin (FLOMAX) 0.4 MG CAPS capsule Take 1 capsule (0.4 mg total) by mouth daily. 04/13/18   Carlisle Cater, PA-C    Physical Exam: Vitals:   04/12/18 2218 04/13/18 0219 04/13/18 0303  BP: (!) 152/68 122/67 116/70  Pulse: 62 61 (!) 57  Resp: 18 16 17   Temp: 98.4 F (36.9 C)  98 F (36.7 C)  TempSrc: Oral  Oral  SpO2: 100% 96% 98%   General: Not in acute distress HEENT:  Eyes: PERRL, EOMI, no scleral icterus.       ENT: No discharge from the ears and nose, no pharynx injection, no tonsillar enlargement.        Neck: No JVD, no bruit, no mass felt. Heme: No neck lymph node enlargement. Cardiac: S1/S2, RRR, No murmurs, No gallops or rubs. Respiratory:  No rales, wheezing, rhonchi or rubs. GI: Soft, nondistended, nontender, no rebound pain, no organomegaly, BS present. GU: positive R CVA tenderness. Ext: No pitting leg edema bilaterally. 2+DP/PT pulse bilaterally. Musculoskeletal: No joint deformities, No joint redness or warmth, no limitation of ROM in spin. Skin: No rashes.  Neuro: Alert, oriented X3, cranial nerves II-XII grossly intact, moves all extremities normally. Psych: Patient is not psychotic, no suicidal or hemocidal ideation.  Labs on Admission: I have personally reviewed following labs and imaging studies  CBC: Recent Labs  Lab 04/12/18 2317  WBC 12.0*  NEUTROABS 8.8*  HGB 14.0  HCT 41.9  MCV 91.3  PLT 962   Basic Metabolic Panel: Recent Labs  Lab 04/12/18 2317  NA 132*  K 4.6  CL 98*  CO2 23  GLUCOSE 101*  BUN 28*  CREATININE 2.24*  CALCIUM 8.9   GFR: CrCl cannot be calculated (Unknown ideal weight.). Liver Function Tests: No results for input(s): AST, ALT, ALKPHOS, BILITOT, PROT, ALBUMIN in the last 168 hours. No results for input(s): LIPASE, AMYLASE in  the last 168 hours. No results for input(s): AMMONIA in the last 168 hours. Coagulation Profile: No results for input(s): INR, PROTIME in the last 168 hours. Cardiac Enzymes: No results for input(s): CKTOTAL, CKMB, CKMBINDEX, TROPONINI in the last 168 hours. BNP (last 3 results) No results for input(s): PROBNP in the last 8760 hours. HbA1C: No results for input(s): HGBA1C in the last 72 hours. CBG: Recent Labs  Lab 04/13/18 0305  GLUCAP 102*   Lipid Profile: No results for input(s): CHOL, HDL, LDLCALC, TRIG, CHOLHDL, LDLDIRECT in the last 72 hours. Thyroid Function Tests: No results for input(s): TSH, T4TOTAL, FREET4, T3FREE, THYROIDAB in the last 72 hours. Anemia Panel: No results for input(s): VITAMINB12, FOLATE, FERRITIN, TIBC, IRON, RETICCTPCT in the last 72 hours. Urine analysis:    Component Value Date/Time   COLORURINE COLORLESS (A) 04/12/2018 2304   APPEARANCEUR CLEAR 04/12/2018 2304   LABSPEC 1.003 (L) 04/12/2018 2304   PHURINE 5.0 04/12/2018 2304   GLUCOSEU NEGATIVE 04/12/2018 2304   HGBUR SMALL (A) 04/12/2018 2304   BILIRUBINUR NEGATIVE 04/12/2018 2304   KETONESUR NEGATIVE 04/12/2018 2304   PROTEINUR NEGATIVE 04/12/2018 2304   UROBILINOGEN 0.2 08/31/2009 1404   NITRITE NEGATIVE 04/12/2018 2304   LEUKOCYTESUR NEGATIVE 04/12/2018 2304   Sepsis Labs: @LABRCNTIP (procalcitonin:4,lacticidven:4) )No results found for this or any previous visit (from the past 240 hour(s)).   Radiological Exams on Admission: Ct Renal Stone Study  Result Date: 04/12/2018 CLINICAL DATA:  Acute onset of right flank and right groin pain. EXAM: CT ABDOMEN AND PELVIS WITHOUT CONTRAST TECHNIQUE: Multidetector CT imaging of the abdomen and pelvis was performed following the standard protocol without IV contrast. COMPARISON:  Pelvic radiograph performed 09/04/2009, and abdominal ultrasound performed 02/13/2007 FINDINGS: Lower chest: The visualized lung bases are grossly clear. Mild coronary  artery calcifications are seen. Hepatobiliary: The liver is unremarkable in appearance. The gallbladder is unremarkable in appearance. The common bile duct remains normal in caliber. Pancreas: The pancreas is within normal limits. Spleen: The spleen is unremarkable in appearance. Adrenals/Urinary Tract: The adrenal glands are unremarkable in appearance. Mild right-sided hydronephrosis is  noted, with an obstructing 4 mm stone noted distally at the right vesicoureteral junction. Right-sided perinephric stranding and fluid are seen. Nonspecific left-sided perinephric stranding is noted. A 3 mm nonobstructing right renal stone is noted. Stomach/Bowel: The stomach is unremarkable in appearance. The small bowel is within normal limits. The appendix is normal in caliber, without evidence of appendicitis. Mild scattered diverticulosis is noted along the descending and sigmoid colon, without evidence of diverticulitis. Vascular/Lymphatic: Scattered calcification is seen along the abdominal aorta and its branches. A circumaortic left renal vein is noted. The inferior vena cava is grossly unremarkable. No retroperitoneal lymphadenopathy is seen. No pelvic sidewall lymphadenopathy is identified. Reproductive: The bladder is mildly distended and otherwise unremarkable. The prostate remains normal in size. Other: No additional soft tissue abnormalities are seen. Musculoskeletal: No acute osseous abnormalities are identified. There is minimal grade 1 anterolisthesis of L4 on L5, reflecting underlying facet disease. The patient's left hip arthroplasty is incompletely imaged, but appears grossly unremarkable. The visualized musculature is unremarkable in appearance. IMPRESSION: 1. Mild right-sided hydronephrosis, with an obstructing 4 mm stone noted distally at the right vesicoureteral junction. 2. 3 mm nonobstructing right renal stone noted. 3. Mild scattered diverticulosis along the descending and sigmoid colon, without evidence  of diverticulitis. 4. Mild coronary artery calcifications seen. Aortic Atherosclerosis (ICD10-I70.0). Electronically Signed   By: Garald Balding M.D.   On: 04/12/2018 23:53     EKG:  Not done in ED, will get one.   Assessment/Plan Principal Problem:   Obstruction of right ureteropelvic junction (UPJ) due to stone Active Problems:   Hyperlipidemia   Hypertension   Coronary artery disease   Obstructive sleep apnea   Hydronephrosis of right kidney   Flank pain   Type II diabetes mellitus with renal manifestations (HCC)   Acute renal failure superimposed on stage 3 chronic kidney disease (HCC)   GERD (gastroesophageal reflux disease)   Gout   R flank pain due to obstruction of right ureteropelvic junction (UPJ) due to stone and hydronephrosis of right kidney: Urology will be consulted by EDP.  Urinalysis negative.  Patient has leukocytosis, no fever, clinically not septic.  -will admit to tele bed as inpt -keep pt NPO in case pt needs surgery. -IVF: 500 cc NS, then 125 cc/h -start Flomax -PRN morphine and percocet for pain, Zofran for nausea  Coronary artery disease: no CP -Continue aspirin, Lipitor, metoprolol -PRN nitroglycerin  HLD: -Lipitor  HTN:  -Continue home medications: Metoprolol - Hold ramipril due to worsening renal function -IV hydralazine prn  AoCKD-III: Baseline Cre is 1.1, pt's Cre is 2.24 and BUN 28 on admission.  Likely due to multifactorial etiology, including dehydration and continuation of ARB and NSAIDs; plus obstructive kidney stone - IVF as above - Follow up renal function by BMP - Check FeNa  - Hold ramipril and indomethacin  Type II diabetes mellitus with renal manifestations: Last A1c 9.4 on 01/28/14, poorly controled. Patient is taking Trulicity and metformin at home -SSI  GERD: -Pepcid  Gout: stable -Hold indomethacin due to worsening renal function  OSA: -CPAP  DVT ppx: SCD Code Status: Full code Family Communication:   Yes,  patient's  wife  at bed side Disposition Plan:  Anticipate discharge back to previous home environment Consults called:  EDP will call urology Admission status:  medical floor/inpt  Date of Service 04/13/2018    Ivor Costa [facility] Pager [phone]  If 7PM-7AM, please contact night-coverage [email or web address] Password [email or web address] 04/13/2018, 3:09 AM

## 2018-04-13 NOTE — Progress Notes (Signed)
Pt came up from ED, a/o x 4. Oriented to room, call bell.Pain is mild rt now. Went to the bathroom and urinate and able to pass out a stone. Pt said "feels so much relief".

## 2018-04-13 NOTE — Discharge Instructions (Signed)
Please read and follow all provided instructions.  Your diagnoses today include: No diagnosis found.  Tests performed today include:  Urine test that showed blood in your urine and no infection  CT scan which showed a 3.5 millimeter kidney on the right side  Blood test that showed normal kidney function  Vital signs. See below for your results today.   Medications prescribed:   Percocet (oxycodone/acetaminophen) - narcotic pain medication  DO NOT drive or perform any activities that require you to be awake and alert because this medicine can make you drowsy. BE VERY CAREFUL not to take multiple medicines containing Tylenol (also called acetaminophen). Doing so can lead to an overdose which can damage your liver and cause liver failure and possibly death.   Zofran (ondansetron) - for nausea and vomiting   Flomax (tamsulosin) - relaxes smooth muscle to help kidney stones pass  Take any prescribed medications only as directed.  Home care instructions:  Follow any educational materials contained in this packet.  Please double your fluid intake for the next several days. Strain your urine and save any stones that may pass.   BE VERY CAREFUL not to take multiple medicines containing Tylenol (also called acetaminophen). Doing so can lead to an overdose which can damage your liver and cause liver failure and possibly death.   Follow-up instructions: Please follow-up with your urologist or the urologist referral (provided on front page) in the next 1 week for further evaluation of your symptoms.  If you need to return to the Emergency Department, go to [facility] and not [facility]. The urologists are located at [facility] and can better care for you at this location.  Return instructions:  If you need to return to the Emergency Department, go to [facility] and not [facility]. The urologists are located at [facility] and can better care for you  at this location.   Please return to the Emergency Department if you experience worsening symptoms.  Please return if you develop fever or uncontrolled pain or vomiting.  Please return if you have any other emergent concerns.  Additional Information:  Your vital signs today were: BP (!) 152/68 (BP Location: Right Arm)    Pulse 62    Temp 98.4 F (36.9 C) (Oral)    Resp 18    SpO2 100%  If your blood pressure (BP) was elevated above 135/85 this visit, please have this repeated by your doctor within one month. --------------

## 2018-04-13 NOTE — Progress Notes (Signed)
RT has been caught up in ICU. RT will pass on to day shift therapist 6N 30 would like to wear CPAP at night.

## 2018-04-13 NOTE — Discharge Summary (Signed)
Physician Discharge Summary  Peter Gray. GXQ:[id number] DOB: [birth year] DOA: 04/12/2018  PCP: [name], MD  Admit date: 04/12/2018 Discharge date: 04/13/2018  Admitted From: Home Disposition: Home  Recommendations for Outpatient Follow-up:  1. Follow up with PCP in 1-2 weeks 2. Please obtain BMP/CBC in one week your next doctors visit.  3. Follow-up with outpatient urology as needed  Home Health: None Equipment/Devices: None Discharge Condition: Stable CODE STATUS: Full code Diet recommendation: Heart healthy/diabetic  Brief/Interim Summary: 62 year old male with history of CKD stage III, hypertension, hyperlipidemia, diabetes mellitus type 2, GERD, CAD status post PCI, obstructive sleep apnea came to the hospital with complains of right-sided flank pain.  This started about 3 days prior to the admission and over the course of few days it somewhat worsened.  In the ER patient had CT of the abdomen done with renal stone protocol which showed mild right-sided hydronephrosis with obstructive 4 mm stone distally at the right vesicoureteral junction, 3 mm nonobstructive right renal stone.  Urology was consulted.  By the time patient was seen by urology patient already passed the stone and he had kept it at bedside.  No further urologic inpatient treatment necessary, he was advised to be discharged home with outpatient follow-up and as necessary. He is reached maximum benefit from an hospital stay and stable to be discharged.  Discharge Diagnoses:  Principal Problem:   Obstruction of right ureteropelvic junction (UPJ) due to stone Active Problems:   Hyperlipidemia   Hypertension   Coronary artery disease   Obstructive sleep apnea   Hydronephrosis of right kidney   Flank pain   Type II diabetes mellitus with renal manifestations (HCC)   Acute renal failure superimposed on stage 3 chronic kidney disease (HCC)   GERD (gastroesophageal reflux disease)   Gout   Renal stone  Right  flank pain with obstructive renal stone and mild hydronephrosis -Patient  passed the stone prior to morning urology evaluation.  He would like to go home therefore we will discharge him, no further inpatient intervention necessary.  Follow-up outpatient urology as needed otherwise follow-up with PCP in about 2 weeks  Coronary artery disease -Stable, he does not have any chest pain.  Resume home medications  Hyperlipidemia -Resume home medications  Acute kidney injury CKD stage III - Likely secondary to obstruction from the stone.  He is advised to hydrate orally as much as possible.  And get repeat lab work at his next primary care visit to ensure continued improvement of renal function.  Diabetes mellitus type 2 -Resume home meds  GERD -Pepcid  OSA -CPAP at home as necessary.  Discharge Instructions   Allergies as of 04/13/2018      Reactions   Bee Venom Swelling      Medication List    TAKE these medications   aspirin EC 81 MG tablet Take 81 mg by mouth daily.   atorvastatin 80 MG tablet Commonly known as:  LIPITOR Take 1 tablet (80 mg total) by mouth daily.   cetirizine 10 MG tablet Commonly known as:  ZYRTEC Take 10 mg by mouth at bedtime.   famotidine 20 MG tablet Commonly known as:  PEPCID Take 20 mg by mouth 2 (two) times daily.   indomethacin 75 MG CR capsule Commonly known as:  INDOCIN SR Take 75 mg by mouth 3 (three) times daily as needed for mild pain.   metFORMIN 500 MG tablet Commonly known as:  GLUCOPHAGE Take 500 mg by mouth 2 (two) times  daily with a meal.   metoprolol succinate 100 MG 24 hr tablet Commonly known as:  TOPROL-XL Take 1 tablet (100 mg total) by mouth daily. Take with or immediately following a meal.   multivitamin tablet Take 1 tablet by mouth daily.   nitroGLYCERIN 0.4 MG SL tablet Commonly known as:  NITROSTAT Place 1 tablet (0.4 mg total) under the tongue every 5 (five) minutes as needed for chest pain.   omega-3 acid  ethyl esters 1 g capsule Commonly known as:  LOVAZA Take 4 capsules (4 g total) by mouth daily.   ondansetron 4 MG disintegrating tablet Commonly known as:  ZOFRAN ODT Take 1 tablet (4 mg total) by mouth every 8 (eight) hours as needed for nausea or vomiting.   oxyCODONE-acetaminophen 5-325 MG tablet Commonly known as:  PERCOCET/ROXICET Take 1-2 tablets by mouth every 6 (six) hours as needed for severe pain.   ramipril 2.5 MG capsule Commonly known as:  ALTACE Take 1 capsule (2.5 mg total) by mouth daily.   tamsulosin 0.4 MG Caps capsule Commonly known as:  FLOMAX Take 1 capsule (0.4 mg total) by mouth daily.   TRULICITY 1.5 UT/6.5YY Sopn Generic drug:  Dulaglutide Inject 1.5 mg into the skin once a week. On Sunday      Follow-up Information    McKenzie, Candee Furbish, MD In 1 week.   Specialty:  Urology Contact information: [place] [address] [phone]        [facility] DEPT.   Specialty:  Emergency Medicine Why:  If symptoms worsen Contact information: [place] [id number] [place] [address] [phone]         Allergies  Allergen Reactions  . Bee Venom Swelling    You were cared for by a hospitalist during your hospital stay. If you have any questions about your discharge medications or the care you received while you were in the hospital after you are discharged, you can call the unit and asked to speak with the hospitalist on call if the hospitalist that took care of you is not available. Once you are discharged, your primary care physician will handle any further medical issues. Please note that no refills for any discharge medications will be authorized once you are discharged, as it is imperative that you return to your primary care physician (or establish a relationship with a primary care physician if you do not have one) for your aftercare needs so that they can reassess your  need for medications and monitor your lab values.  Consultations:  Urology   Procedures/Studies: Ct Renal Stone Study  Result Date: 04/12/2018 CLINICAL DATA:  Acute onset of right flank and right groin pain. EXAM: CT ABDOMEN AND PELVIS WITHOUT CONTRAST TECHNIQUE: Multidetector CT imaging of the abdomen and pelvis was performed following the standard protocol without IV contrast. COMPARISON:  Pelvic radiograph performed 09/04/2009, and abdominal ultrasound performed 02/13/2007 FINDINGS: Lower chest: The visualized lung bases are grossly clear. Mild coronary artery calcifications are seen. Hepatobiliary: The liver is unremarkable in appearance. The gallbladder is unremarkable in appearance. The common bile duct remains normal in caliber. Pancreas: The pancreas is within normal limits. Spleen: The spleen is unremarkable in appearance. Adrenals/Urinary Tract: The adrenal glands are unremarkable in appearance. Mild right-sided hydronephrosis is noted, with an obstructing 4 mm stone noted distally at the right vesicoureteral junction. Right-sided perinephric stranding and fluid are seen. Nonspecific left-sided perinephric stranding is noted. A 3 mm nonobstructing right renal stone  is noted. Stomach/Bowel: The stomach is unremarkable in appearance. The small bowel is within normal limits. The appendix is normal in caliber, without evidence of appendicitis. Mild scattered diverticulosis is noted along the descending and sigmoid colon, without evidence of diverticulitis. Vascular/Lymphatic: Scattered calcification is seen along the abdominal aorta and its branches. A circumaortic left renal vein is noted. The inferior vena cava is grossly unremarkable. No retroperitoneal lymphadenopathy is seen. No pelvic sidewall lymphadenopathy is identified. Reproductive: The bladder is mildly distended and otherwise unremarkable. The prostate remains normal in size. Other: No additional soft tissue abnormalities are seen.  Musculoskeletal: No acute osseous abnormalities are identified. There is minimal grade 1 anterolisthesis of L4 on L5, reflecting underlying facet disease. The patient's left hip arthroplasty is incompletely imaged, but appears grossly unremarkable. The visualized musculature is unremarkable in appearance. IMPRESSION: 1. Mild right-sided hydronephrosis, with an obstructing 4 mm stone noted distally at the right vesicoureteral junction. 2. 3 mm nonobstructing right renal stone noted. 3. Mild scattered diverticulosis along the descending and sigmoid colon, without evidence of diverticulitis. 4. Mild coronary artery calcifications seen. Aortic Atherosclerosis (ICD10-I70.0). Electronically Signed   By: Garald Balding M.D.   On: 04/12/2018 23:53      Subjective: No complaints.  Feeling much better and back to himself.  General = no fevers, chills, dizziness, malaise, fatigue HEENT/EYES = negative for pain, redness, loss of vision, double vision, blurred vision, loss of hearing, sore throat, hoarseness, dysphagia Cardiovascular= negative for chest pain, palpitation, murmurs, lower extremity swelling Respiratory/lungs= negative for shortness of breath, cough, hemoptysis, wheezing, mucus production Gastrointestinal= negative for nausea, vomiting,, abdominal pain, melena, hematemesis Genitourinary= negative for Dysuria, Hematuria, Change in Urinary Frequency MSK = Negative for arthralgia, myalgias, Back Pain, Joint swelling  Neurology= Negative for headache, seizures, numbness, tingling  Psychiatry= Negative for anxiety, depression, suicidal and homocidal ideation Allergy/Immunology= Medication/Food allergy as listed  Skin= Negative for Rash, lesions, ulcers, itching   Discharge Exam: Vitals:   04/13/18 0219 04/13/18 0303  BP: 122/67 116/70  Pulse: 61 (!) 57  Resp: 16 17  Temp:  98 F (36.7 C)  SpO2: 96% 98%   Vitals:   04/12/18 2218 04/13/18 0219 04/13/18 0303  BP: (!) 152/68 122/67 116/70   Pulse: 62 61 (!) 57  Resp: 18 16 17   Temp: 98.4 F (36.9 C)  98 F (36.7 C)  TempSrc: Oral  Oral  SpO2: 100% 96% 98%  Height:   6\' 3"  (1.905 m)    General: Pt is alert, awake, not in acute distress Cardiovascular: RRR, S1/S2 +, no rubs, no gallops Respiratory: CTA bilaterally, no wheezing, no rhonchi Abdominal: Soft, NT, ND, bowel sounds + Extremities: no edema, no cyanosis    The results of significant diagnostics from this hospitalization (including imaging, microbiology, ancillary and laboratory) are listed below for reference.     Microbiology: No results found for this or any previous visit (from the past 240 hour(s)).   Labs: BNP (last 3 results) No results for input(s): BNP in the last 8760 hours. Basic Metabolic Panel: Recent Labs  Lab 04/12/18 2317 04/13/18 0431  NA 132* 138  K 4.6 4.9  CL 98* 107  CO2 23 25  GLUCOSE 101* 110*  BUN 28* 25*  CREATININE 2.24* 2.02*  CALCIUM 8.9 8.8*   Liver Function Tests: No results for input(s): AST, ALT, ALKPHOS, BILITOT, PROT, ALBUMIN in the last 168 hours. No results for input(s): LIPASE, AMYLASE in the last 168 hours. No results for input(s): AMMONIA in  the last 168 hours. CBC: Recent Labs  Lab 04/12/18 2317 04/13/18 0431  WBC 12.0* 9.7  NEUTROABS 8.8*  --   HGB 14.0 13.1  HCT 41.9 39.4  MCV 91.3 90.4  PLT 225 212   Cardiac Enzymes: No results for input(s): CKTOTAL, CKMB, CKMBINDEX, TROPONINI in the last 168 hours. BNP: Invalid input(s): POCBNP CBG: Recent Labs  Lab 04/13/18 0305 04/13/18 0800  GLUCAP 102* 92   D-Dimer No results for input(s): DDIMER in the last 72 hours. Hgb A1c No results for input(s): HGBA1C in the last 72 hours. Lipid Profile No results for input(s): CHOL, HDL, LDLCALC, TRIG, CHOLHDL, LDLDIRECT in the last 72 hours. Thyroid function studies No results for input(s): TSH, T4TOTAL, T3FREE, THYROIDAB in the last 72 hours.  Invalid input(s): FREET3 Anemia work up No results  for input(s): VITAMINB12, FOLATE, FERRITIN, TIBC, IRON, RETICCTPCT in the last 72 hours. Urinalysis    Component Value Date/Time   COLORURINE COLORLESS (A) 04/12/2018 2304   APPEARANCEUR CLEAR 04/12/2018 2304   LABSPEC 1.003 (L) 04/12/2018 2304   PHURINE 5.0 04/12/2018 2304   GLUCOSEU NEGATIVE 04/12/2018 2304   HGBUR SMALL (A) 04/12/2018 2304   BILIRUBINUR NEGATIVE 04/12/2018 2304   KETONESUR NEGATIVE 04/12/2018 2304   PROTEINUR NEGATIVE 04/12/2018 2304   UROBILINOGEN 0.2 08/31/2009 1404   NITRITE NEGATIVE 04/12/2018 2304   LEUKOCYTESUR NEGATIVE 04/12/2018 2304   Sepsis Labs Invalid input(s): PROCALCITONIN,  WBC,  LACTICIDVEN Microbiology No results found for this or any previous visit (from the past 240 hour(s)).   Time coordinating discharge:  I have spent 35 minutes face to face with the patient and on the ward discussing the patients care, assessment, plan and disposition with other care givers. >50% of the time was devoted counseling the patient about the risks and benefits of treatment/Discharge disposition and coordinating care.   SIGNED:   Damita Lack, MD  [facility] 04/13/2018, 1:02 PM Pager   If 7PM-7AM, please contact night-coverage [email or web address] Password [email or web address]

## 2018-04-13 NOTE — ED Provider Notes (Signed)
Care assumed from [facility] [place].  Please see her full H&P.  In short,  Peter Gray. is a 62 y.o. male Hx of MI and guide presents for right flank and RLQ pain onset 3 days ago with associated nausea.  No hx of kidney stones.    Physical Exam  BP (!) 152/68 (BP Location: Right Arm)   Pulse 62   Temp 98.4 F (36.9 C) (Oral)   Resp 18   SpO2 100%   Physical Exam  Constitutional: Peter Gray appears well-developed and well-nourished. No distress.  HENT:  Head: Normocephalic.  Eyes: Conjunctivae are normal. No scleral icterus.  Neck: Normal range of motion.  Cardiovascular: Normal rate and intact distal pulses.  Pulmonary/Chest: Effort normal.  Musculoskeletal: Normal range of motion.  Neurological: Peter Gray is alert.  Skin: Skin is warm and dry.  Nursing note and vitals reviewed.   ED Course/Procedures   Clinical Course as of Apr 13 240  Mon Apr 13, 2018  0030 Elevated.  Baseline 1.2  Creatinine(!): 2.24 [HM]  0052 Plan: Pain control and reassess   [HM]  0100 Noted, likely pain induced  BP(!): 152/68 [HM]  0138 Discussed with D. Niu who will admit.  Will consult with urology as well   [HM]  0139 Mild.  No fever or signs of UTI  WBC(!): 12.0 [HM]  0240 Discussed with Dr. Gloriann Loan of urology who will evaluate in the AM   [HM]    Clinical Course User Index [HM] Alaijah Gibler, Gwenlyn Perking    Procedures  MDM   Peter Gray presents with right flank pain.  CT scan shows right ureteral stone at 4 mm.  It is obstructing however Peter Gray only has mild hydronephrosis.  Basic lab work shows mild leukocytosis however Peter Gray is without fever or chills.  No hypotension.  Urinalysis without evidence of infection.  No evidence of sepsis.  Creatinine 2.24 up from Peter Gray's baseline at 1.1-1.2.  There is no evidence of stone within the left ureter.  Suspect that Peter Gray's acute kidney injury may be secondary to his home medications as opposed to ureteral stone.  Urology consulted Peter Gray will evaluate in the  morning.  Peter Gray admitted to hospitalist service.   Ureteral colic  Acute kidney injury [facility])  AKI (acute kidney injury) [facility])      Dannette Kinkaid, Gwenlyn Perking 04/13/18 0243    Fatima Blank, MD 04/13/18 [phone]

## 2018-04-20 DIAGNOSIS — I129 Hypertensive chronic kidney disease with stage 1 through stage 4 chronic kidney disease, or unspecified chronic kidney disease: Secondary | ICD-10-CM | POA: Diagnosis not present

## 2018-04-20 DIAGNOSIS — N200 Calculus of kidney: Secondary | ICD-10-CM | POA: Diagnosis not present

## 2018-04-20 DIAGNOSIS — I2510 Atherosclerotic heart disease of native coronary artery without angina pectoris: Secondary | ICD-10-CM | POA: Diagnosis not present

## 2018-04-20 DIAGNOSIS — R109 Unspecified abdominal pain: Secondary | ICD-10-CM | POA: Diagnosis not present

## 2018-05-12 DIAGNOSIS — H524 Presbyopia: Secondary | ICD-10-CM | POA: Diagnosis not present

## 2018-05-12 DIAGNOSIS — H25013 Cortical age-related cataract, bilateral: Secondary | ICD-10-CM | POA: Diagnosis not present

## 2018-05-12 DIAGNOSIS — H2513 Age-related nuclear cataract, bilateral: Secondary | ICD-10-CM | POA: Diagnosis not present

## 2018-05-12 DIAGNOSIS — E119 Type 2 diabetes mellitus without complications: Secondary | ICD-10-CM | POA: Diagnosis not present

## 2018-05-12 DIAGNOSIS — H40013 Open angle with borderline findings, low risk, bilateral: Secondary | ICD-10-CM | POA: Diagnosis not present

## 2018-05-28 DIAGNOSIS — R319 Hematuria, unspecified: Secondary | ICD-10-CM | POA: Diagnosis not present

## 2018-06-16 DIAGNOSIS — E1129 Type 2 diabetes mellitus with other diabetic kidney complication: Secondary | ICD-10-CM | POA: Diagnosis not present

## 2018-06-16 DIAGNOSIS — I10 Essential (primary) hypertension: Secondary | ICD-10-CM | POA: Diagnosis not present

## 2018-07-16 DIAGNOSIS — M7742 Metatarsalgia, left foot: Secondary | ICD-10-CM | POA: Diagnosis not present

## 2018-07-16 DIAGNOSIS — M7741 Metatarsalgia, right foot: Secondary | ICD-10-CM | POA: Diagnosis not present

## 2018-07-16 DIAGNOSIS — M722 Plantar fascial fibromatosis: Secondary | ICD-10-CM | POA: Diagnosis not present

## 2018-07-22 ENCOUNTER — Telehealth: Payer: Self-pay | Admitting: Cardiology

## 2018-07-22 DIAGNOSIS — I2510 Atherosclerotic heart disease of native coronary artery without angina pectoris: Secondary | ICD-10-CM

## 2018-07-22 NOTE — Telephone Encounter (Signed)
I would normally do a stress test every 3 years unless he is having symptoms. We can wait until next year unless he really wants to do so now.,   Cerenity Goshorn Martinique MD, [facility]

## 2018-07-22 NOTE — Telephone Encounter (Signed)
New message:      Pt is calling to see if a stress test order can be put in for him and if it is time for him to have it. Pt states he has reached his out of pocket expense and he would like to get this test done before the end of the year.

## 2018-07-22 NOTE — Telephone Encounter (Signed)
Returned call to patient, he states he has reached his out of pocket max this year and thinks he is due for a stress test.  He is unsure how often Dr. Martinique wanted him to have this test but he thought he would see if he could have this now due to insurance.   Advised last was in 2017, he denies any cardiac issues or symptoms at this time.  He does report having issues with his DM.  He was started on trulicity but recently had to discontinue this due to side effects.   He is now monitoring blood sugar closely.  Advised would route to MD to review.

## 2018-07-23 DIAGNOSIS — M79601 Pain in right arm: Secondary | ICD-10-CM | POA: Diagnosis not present

## 2018-07-23 DIAGNOSIS — G5601 Carpal tunnel syndrome, right upper limb: Secondary | ICD-10-CM | POA: Diagnosis not present

## 2018-07-23 DIAGNOSIS — M79602 Pain in left arm: Secondary | ICD-10-CM | POA: Diagnosis not present

## 2018-07-23 DIAGNOSIS — G5602 Carpal tunnel syndrome, left upper limb: Secondary | ICD-10-CM | POA: Diagnosis not present

## 2018-07-23 NOTE — Telephone Encounter (Signed)
Spoke to patient he stated he would like to have stress myoview this year since he has reached his out of pocket max.Advised scheduler will call back to schedule.

## 2018-07-24 ENCOUNTER — Encounter: Payer: Self-pay | Admitting: Cardiology

## 2018-07-28 DIAGNOSIS — I10 Essential (primary) hypertension: Secondary | ICD-10-CM | POA: Diagnosis not present

## 2018-07-28 DIAGNOSIS — R197 Diarrhea, unspecified: Secondary | ICD-10-CM | POA: Diagnosis not present

## 2018-07-28 DIAGNOSIS — Z6832 Body mass index (BMI) 32.0-32.9, adult: Secondary | ICD-10-CM | POA: Diagnosis not present

## 2018-07-28 DIAGNOSIS — E1129 Type 2 diabetes mellitus with other diabetic kidney complication: Secondary | ICD-10-CM | POA: Diagnosis not present

## 2018-09-09 ENCOUNTER — Telehealth (HOSPITAL_COMMUNITY): Payer: Self-pay

## 2018-09-09 NOTE — Telephone Encounter (Signed)
Encounter complete. 

## 2018-09-14 ENCOUNTER — Other Ambulatory Visit: Payer: Self-pay | Admitting: Cardiology

## 2018-09-14 MED ORDER — OMEGA-3-ACID ETHYL ESTERS 1 G PO CAPS: 4.0000 | ORAL_CAPSULE | Freq: Every day | ORAL | 3 refills | Status: DC

## 2018-09-14 NOTE — Telephone Encounter (Signed)
 *  STAT* If patient is at the pharmacy, call can be transferred to refill team.   1. Which medications need to be refilled? (please list name of each medication and dose if known) omega-3 acid ethyl esters (LOVAZA) 1 g capsule  2. Which pharmacy/location (including street and city if local pharmacy) is medication to be sent to? [place]  3. Do they need a 30 day or 90 day supply? [place]

## 2018-09-14 NOTE — Telephone Encounter (Signed)
Patient has been notified directly and voiced understanding.   

## 2018-09-15 ENCOUNTER — Ambulatory Visit (HOSPITAL_COMMUNITY)
Admission: RE | Admit: 2018-09-15 | Payer: BLUE CROSS/BLUE SHIELD | Source: Ambulatory Visit | Attending: Cardiology | Admitting: Cardiology

## 2018-09-18 ENCOUNTER — Telehealth: Payer: Self-pay | Admitting: Cardiology

## 2018-09-18 DIAGNOSIS — Z6832 Body mass index (BMI) 32.0-32.9, adult: Secondary | ICD-10-CM | POA: Diagnosis not present

## 2018-09-18 DIAGNOSIS — I10 Essential (primary) hypertension: Secondary | ICD-10-CM | POA: Diagnosis not present

## 2018-09-18 DIAGNOSIS — I2510 Atherosclerotic heart disease of native coronary artery without angina pectoris: Secondary | ICD-10-CM

## 2018-09-18 DIAGNOSIS — E785 Hyperlipidemia, unspecified: Secondary | ICD-10-CM

## 2018-09-18 DIAGNOSIS — E1129 Type 2 diabetes mellitus with other diabetic kidney complication: Secondary | ICD-10-CM | POA: Diagnosis not present

## 2018-09-18 NOTE — Telephone Encounter (Signed)
Patient states he is having lab work at PCP on Thursday AM and was wondering if we needed additional lab work could he have this done at the same time because he is terrified of needles and does not want to get stuck multiple times.    He has not had labs recently by PCP but doesn't think they are checking lipid panel.       Advised would check with Dr. Bess Kinds PA (who is seeing patient 11/14) to see if ok to order additional labs to be completed at PCP if needed.

## 2018-09-18 NOTE — Telephone Encounter (Signed)
New message:      Pt is calling and would like to know if he will need to get lab work done on the day of his visit. Please advise.

## 2018-09-20 NOTE — Telephone Encounter (Signed)
Yes as long as he has chemistry panel and lipids from our standpoint this can be done at primary care  Lennie Dunnigan Martinique MD, [facility]

## 2018-09-21 NOTE — Telephone Encounter (Signed)
Spoke to patient he stated he will have cmet,lipid panel done at our office 09/22/18.Orders placed in lab box.

## 2018-09-23 DIAGNOSIS — E785 Hyperlipidemia, unspecified: Secondary | ICD-10-CM | POA: Diagnosis not present

## 2018-09-23 DIAGNOSIS — I10 Essential (primary) hypertension: Secondary | ICD-10-CM | POA: Diagnosis not present

## 2018-09-23 DIAGNOSIS — I2510 Atherosclerotic heart disease of native coronary artery without angina pectoris: Secondary | ICD-10-CM | POA: Diagnosis not present

## 2018-09-24 ENCOUNTER — Encounter: Payer: Self-pay | Admitting: Physician Assistant

## 2018-09-24 ENCOUNTER — Ambulatory Visit (INDEPENDENT_AMBULATORY_CARE_PROVIDER_SITE_OTHER): Payer: BLUE CROSS/BLUE SHIELD | Admitting: Physician Assistant

## 2018-09-24 VITALS — BP 116/78 | HR 58 | Ht 75.0 in | Wt 253.4 lb

## 2018-09-24 DIAGNOSIS — E119 Type 2 diabetes mellitus without complications: Secondary | ICD-10-CM | POA: Diagnosis not present

## 2018-09-24 DIAGNOSIS — I2510 Atherosclerotic heart disease of native coronary artery without angina pectoris: Secondary | ICD-10-CM | POA: Diagnosis not present

## 2018-09-24 DIAGNOSIS — E785 Hyperlipidemia, unspecified: Secondary | ICD-10-CM | POA: Diagnosis not present

## 2018-09-24 DIAGNOSIS — N200 Calculus of kidney: Secondary | ICD-10-CM

## 2018-09-24 DIAGNOSIS — I10 Essential (primary) hypertension: Secondary | ICD-10-CM | POA: Diagnosis not present

## 2018-09-24 DIAGNOSIS — G4733 Obstructive sleep apnea (adult) (pediatric): Secondary | ICD-10-CM

## 2018-09-24 LAB — COMPREHENSIVE METABOLIC PANEL
ALBUMIN: 4.8 g/dL (ref 3.6–4.8)
ALK PHOS: 96 IU/L (ref 39–117)
ALT: 17 IU/L (ref 0–44)
AST: 16 IU/L (ref 0–40)
Albumin/Globulin Ratio: 2 (ref 1.2–2.2)
BILIRUBIN TOTAL: 0.3 mg/dL (ref 0.0–1.2)
BUN / CREAT RATIO: 17 (ref 10–24)
BUN: 26 mg/dL (ref 8–27)
CO2: 20 mmol/L (ref 20–29)
CREATININE: 1.53 mg/dL — AB (ref 0.76–1.27)
Calcium: 9.8 mg/dL (ref 8.6–10.2)
Chloride: 105 mmol/L (ref 96–106)
GFR calc non Af Amer: 48 mL/min/{1.73_m2} — ABNORMAL LOW (ref >59)
GFR, EST AFRICAN AMERICAN: 56 mL/min/{1.73_m2} — AB (ref >59)
GLOBULIN, TOTAL: 2.4 g/dL (ref 1.5–4.5)
GLUCOSE: 126 mg/dL — AB (ref 65–99)
Potassium: 5 mmol/L (ref 3.5–5.2)
SODIUM: 140 mmol/L (ref 134–144)
TOTAL PROTEIN: 7.2 g/dL (ref 6.0–8.5)

## 2018-09-24 LAB — LIPID PANEL W/O CHOL/HDL RATIO
Cholesterol, Total: 127 mg/dL (ref 100–199)
HDL: 42 mg/dL (ref >39)
LDL Calculated: 66 mg/dL (ref 0–99)
TRIGLYCERIDES: 95 mg/dL (ref 0–149)
VLDL Cholesterol Cal: 19 mg/dL (ref 5–40)

## 2018-09-24 MED ORDER — RAMIPRIL 2.5 MG PO CAPS: 2.5000 mg | ORAL_CAPSULE | Freq: Every day | ORAL | 3 refills | Status: DC

## 2018-09-24 MED ORDER — NITROGLYCERIN 0.4 MG SL SUBL: 0.4000 mg | SUBLINGUAL_TABLET | SUBLINGUAL | 1 refills | Status: DC | PRN

## 2018-09-24 MED ORDER — OMEGA-3-ACID ETHYL ESTERS 1 G PO CAPS: 4.0000 | ORAL_CAPSULE | Freq: Every day | ORAL | 3 refills | Status: DC

## 2018-09-24 MED ORDER — METOPROLOL SUCCINATE ER 100 MG PO TB24: 100.0000 mg | ORAL_TABLET | Freq: Every day | ORAL | 3 refills | Status: DC

## 2018-09-24 MED ORDER — ATORVASTATIN CALCIUM 80 MG PO TABS: 80.0000 mg | ORAL_TABLET | Freq: Every day | ORAL | 3 refills | Status: DC

## 2018-09-24 NOTE — Progress Notes (Signed)
Cardiology Office Note    Date:  09/26/2018   ID:  Peter Gray., DOB 1956/10/28, MRN [id number]  PCP:  [name], MD  Cardiologist: Dr. Martinique  Chief Complaint  Patient presents with  . Follow-up    seen for Dr. Martinique, Annual visit    History of Present Illness:  Peter Gray. is a 62 y.o. male with PMH of CAD s/p lateral MI 2008 with emergent stenting of LCx with BMS, hypertension, hyperlipidemia, DM 2 and obstructive sleep apnea.  Patient had a normal Myoview in 2014 in 2017.  He was diagnosed with right kidney stone with mild hydronephrosis in June 2019.  He spontaneously passed the stone prior to any intervention.  Patient presents today for cardiology office visit.  He denies any recent chest pain or shortness of breath.  He has been doing very well from cardiology perspective.  He does not have any significant lower extremity edema, orthopnea or PND.    Past Medical History:  Diagnosis Date  . Allergy   . Arthritis    joints- in general   . Coronary artery disease   . Diabetes mellitus without complication (HCC)    Hgb A1c- has lately reduced to 5.6, per management by Dr. Reynaldo Minium, has lost 30 +lbs.  . Encounter for blood transfusion    as a newborn, on two occasions had bld. transfusion  . GERD (gastroesophageal reflux disease)   . Gout   . Hepatitis   . Hyperlipidemia   . Hypertension   . Myocardial infarction ([place]) 2008  . Neuromuscular disorder ([place])    funny sensations in both feet on & off, nerves chronically traumatized with problemed feet    . Obesity   . Sleep apnea    uses CPAP- last study- long ago    Past Surgical History:  Procedure Laterality Date  . CORONARY ANGIOPLASTY WITH STENT PLACEMENT    . dental implant    . ELBOW SURGERY Right 1984   bone spur removed   . INSERTION OF MESH N/A 08/31/2014   Procedure: INSERTION OF MESH;  Surgeon: Donnie Mesa, MD;  Location: [facility];  Service: General;  Laterality: N/A;  . JOINT  REPLACEMENT Left 2010   hip  . SIGMOIDOSCOPY     30 yrs ago   . UMBILICAL HERNIA REPAIR  08/31/2014   WITH [place]         DR TSUEI  . UMBILICAL HERNIA REPAIR N/A 08/31/2014   Procedure: HERNIA REPAIR UMBILICAL ADULT;  Surgeon: Donnie Mesa, MD;  Location: [facility];  Service: General;  Laterality: N/A;  . VASECTOMY      Current Medications: Outpatient Medications Prior to Visit  Medication Sig Dispense Refill  . aspirin EC 81 MG tablet Take 81 mg by mouth daily.    . cetirizine (ZYRTEC) 10 MG tablet Take 10 mg by mouth at bedtime.     . famotidine (PEPCID) 20 MG tablet Take 20 mg by mouth 2 (two) times daily.    . indomethacin (INDOCIN SR) 75 MG CR capsule Take 75 mg by mouth 3 (three) times daily as needed for mild pain.     Marland Kitchen JARDIANCE 10 MG TABS tablet   10  . metFORMIN (GLUCOPHAGE) 500 MG tablet Take 500 mg by mouth 2 (two) times daily with a meal.     . Multiple Vitamin (MULTIVITAMIN) tablet Take 1 tablet by mouth daily.    . ondansetron (ZOFRAN ODT) 4 MG disintegrating tablet Take 1 tablet (  4 mg total) by mouth every 8 (eight) hours as needed for nausea or vomiting. 10 tablet 0  . oxyCODONE-acetaminophen (PERCOCET/ROXICET) 5-325 MG tablet Take 1-2 tablets by mouth every 6 (six) hours as needed for severe pain. 10 tablet 0  . atorvastatin (LIPITOR) 80 MG tablet Take 1 tablet (80 mg total) by mouth daily. 90 tablet 3  . metoprolol succinate (TOPROL-XL) 100 MG 24 hr tablet Take 1 tablet (100 mg total) by mouth daily. Take with or immediately following a meal. 90 tablet 3  . nitroGLYCERIN (NITROSTAT) 0.4 MG SL tablet Place 1 tablet (0.4 mg total) under the tongue every 5 (five) minutes as needed for chest pain. 25 tablet 1  . omega-3 acid ethyl esters (LOVAZA) 1 g capsule Take 4 capsules (4 g total) by mouth daily. 360 capsule 3  . ramipril (ALTACE) 2.5 MG capsule Take 1 capsule (2.5 mg total) by mouth daily. (Patient not taking: Reported on 09/24/2018) 90 capsule 3  . tamsulosin (FLOMAX)  0.4 MG CAPS capsule Take 1 capsule (0.4 mg total) by mouth daily. 7 capsule 0  . TRULICITY 1.5 DG/6.4QI SOPN Inject 1.5 mg into the skin once a week. On Sunday  6   Facility-Administered Medications Prior to Visit  Medication Dose Route Frequency Provider Last Rate Last Dose  . 0.9 %  sodium chloride infusion  500 mL Intravenous Continuous Irene Shipper, MD         Allergies:   Bee venom   Social History   Socioeconomic History  . Marital status: Married    Spouse name: Not on file  . Number of children: Not on file  . Years of education: Not on file  . Highest education level: Not on file  Occupational History  . Not on file  Social Needs  . Financial resource strain: Not on file  . Food insecurity:    Worry: Not on file    Inability: Not on file  . Transportation needs:    Medical: Not on file    Non-medical: Not on file  Tobacco Use  . Smoking status: Never Smoker  . Smokeless tobacco: Never Used  Substance and Sexual Activity  . Alcohol use: Yes    Comment: occas,- less than social   . Drug use: No  . Sexual activity: Yes    Birth control/protection: Surgical  Lifestyle  . Physical activity:    Days per week: Not on file    Minutes per session: Not on file  . Stress: Not on file  Relationships  . Social connections:    Talks on phone: Not on file    Gets together: Not on file    Attends religious service: Not on file    Active member of club or organization: Not on file    Attends meetings of clubs or organizations: Not on file    Relationship status: Not on file  Other Topics Concern  . Not on file  Social History Narrative  . Not on file     Family History:  The patient's family history includes Breast cancer in his mother; Coronary artery disease in his father and mother; Heart failure in his father; Other in his father.   ROS:   Please see the history of present illness.    ROS All other systems reviewed and are negative.   PHYSICAL EXAM:   VS:   BP 116/78   Pulse (!) 58   Ht 6\' 3"  (1.905 m)   Wt 253 lb  6.4 oz (114.9 kg)   BMI 31.67 kg/m    GEN: Well nourished, well developed, in no acute distress  HEENT: normal  Neck: no JVD, carotid bruits, or masses Cardiac: RRR; no murmurs, rubs, or gallops,no edema  Respiratory:  clear to auscultation bilaterally, normal work of breathing GI: soft, nontender, nondistended, + BS MS: no deformity or atrophy  Skin: warm and dry, no rash Neuro:  Alert and Oriented x 3, Strength and sensation are intact Psych: euthymic mood, full affect  Wt Readings from Last 3 Encounters:  09/24/18 253 lb 6.4 oz (114.9 kg)  08/22/17 274 lb 12.8 oz (124.6 kg)  11/01/16 263 lb (119.3 kg)      Studies/Labs Reviewed:   EKG:  EKG is not ordered today.  EKG obtained in June 2019 has been reviewed, no obvious ST-T wave changes, normal sinus rhythm  Recent Labs: 04/13/2018: Hemoglobin 13.1; Platelets 212 09/23/2018: ALT 17; BUN 26; Creatinine, Ser 1.53; Potassium 5.0; Sodium 140   Lipid Panel    Component Value Date/Time   CHOL 127 09/23/2018 1217   TRIG 95 09/23/2018 1217   HDL 42 09/23/2018 1217   CHOLHDL 4 01/28/2014 1232   VLDL 29.8 01/28/2014 1232   LDLCALC 66 09/23/2018 1217    Additional studies/ records that were reviewed today include:   Myoview 05/24/16 Study Highlights    Nuclear stress EF: 43%. Visually , the EF appears to be greater than the computer generated EF of 43%. The EF is 50-55% visually  There was no ST segment deviation noted during stress.  The study is normal.  This is a low risk study.       ASSESSMENT:    1. Coronary artery disease involving native coronary artery of native heart without angina pectoris   2. Essential hypertension   3. Hyperlipidemia, unspecified hyperlipidemia type   4. Controlled type 2 diabetes mellitus without complication, without long-term current use of insulin ([place])   5. OSA (obstructive sleep apnea)   6. Kidney stone      PLAN:   In order of problems listed above:  1. CAD: History of BMS to left circumflex artery.  Last Myoview was seen July 2017 which was normal.  He has been able to exert himself without any chest discomfort.  2. Hypertension: Blood pressure stable on current therapy.  3. Hyperlipidemia: On Lipitor 80 mg daily.  Cholesterol lab work yesterday showed very well-controlled cholesterol.  LDL was 66.  4. DM2: Followed by primary care provider  5. Obstructive sleep apnea: On CPAP  6. Kidney stone: He has passed 2 kidney stones this year.    Medication Adjustments/Labs and Tests Ordered: Current medicines are reviewed at length with the patient today.  Concerns regarding medicines are outlined above.  Medication changes, Labs and Tests ordered today are listed in the Patient Instructions below. Patient Instructions  Medication Instructions:  Your physician recommends that you continue on your current medications as directed. Please refer to the Current Medication list given to you today. If you need a refill on your cardiac medications before your next appointment, please call your pharmacy.   Lab work: None  If you have labs (blood work) drawn today and your tests are completely normal, you will receive your results only by: Marland Kitchen MyChart Message (if you have MyChart) OR . A paper copy in the mail If you have any lab test that is abnormal or we need to change your treatment, we will call you to review the results.  Testing/Procedures: None   Follow-Up: At [facility], you and your health needs are our priority.  As part of our continuing mission to provide you with exceptional heart care, we have created designated Provider Care Teams.  These Care Teams include your primary Cardiologist (physician) and Advanced Practice Providers (APPs -  Physician Assistants and Nurse Practitioners) who all work together to provide you with the care you need, when you need it. You will need a follow up  appointment in 12 months.  Please call our office 2 months in advance to schedule this appointment.  You may see Dr Peter Martinique or one of the following Advanced Practice Providers on your designated Care Team: [place], [place] . Fabian Sharp, PA-C  Any Other Special Instructions Will Be Listed Below (If Applicable).      Hilbert Corrigan, [place]  09/26/2018 11:57 PM    [facility] [place], [place], [place]  [address] Phone: [phone]; Fax: [phone]

## 2018-09-24 NOTE — Patient Instructions (Signed)
Medication Instructions:  Your physician recommends that you continue on your current medications as directed. Please refer to the Current Medication list given to you today. If you need a refill on your cardiac medications before your next appointment, please call your pharmacy.   Lab work: None  If you have labs (blood work) drawn today and your tests are completely normal, you will receive your results only by: Marland Kitchen MyChart Message (if you have MyChart) OR . A paper copy in the mail If you have any lab test that is abnormal or we need to change your treatment, we will call you to review the results.  Testing/Procedures: None   Follow-Up: At [facility], you and your health needs are our priority.  As part of our continuing mission to provide you with exceptional heart care, we have created designated Provider Care Teams.  These Care Teams include your primary Cardiologist (physician) and Advanced Practice Providers (APPs -  Physician Assistants and Nurse Practitioners) who all work together to provide you with the care you need, when you need it. You will need a follow up appointment in 12 months.  Please call our office 2 months in advance to schedule this appointment.  You may see Dr Peter Martinique or one of the following Advanced Practice Providers on your designated Care Team: [place], [place] . Fabian Sharp, PA-C  Any Other Special Instructions Will Be Listed Below (If Applicable).

## 2018-09-26 ENCOUNTER — Encounter: Payer: Self-pay | Admitting: Physician Assistant

## 2018-10-28 ENCOUNTER — Other Ambulatory Visit: Payer: Self-pay

## 2018-11-19 DIAGNOSIS — R82998 Other abnormal findings in urine: Secondary | ICD-10-CM | POA: Diagnosis not present

## 2018-11-19 DIAGNOSIS — E1129 Type 2 diabetes mellitus with other diabetic kidney complication: Secondary | ICD-10-CM | POA: Diagnosis not present

## 2018-11-19 DIAGNOSIS — I10 Essential (primary) hypertension: Secondary | ICD-10-CM | POA: Diagnosis not present

## 2018-11-19 DIAGNOSIS — Z125 Encounter for screening for malignant neoplasm of prostate: Secondary | ICD-10-CM | POA: Diagnosis not present

## 2018-11-19 DIAGNOSIS — Z0000 Encounter for general adult medical examination without abnormal findings: Secondary | ICD-10-CM | POA: Diagnosis not present

## 2018-11-19 DIAGNOSIS — E7849 Other hyperlipidemia: Secondary | ICD-10-CM | POA: Diagnosis not present

## 2018-11-26 DIAGNOSIS — E7849 Other hyperlipidemia: Secondary | ICD-10-CM | POA: Diagnosis not present

## 2018-11-26 DIAGNOSIS — Z1331 Encounter for screening for depression: Secondary | ICD-10-CM | POA: Diagnosis not present

## 2018-11-26 DIAGNOSIS — I2510 Atherosclerotic heart disease of native coronary artery without angina pectoris: Secondary | ICD-10-CM | POA: Diagnosis not present

## 2018-11-26 DIAGNOSIS — Z0000 Encounter for general adult medical examination without abnormal findings: Secondary | ICD-10-CM | POA: Diagnosis not present

## 2018-11-26 DIAGNOSIS — I10 Essential (primary) hypertension: Secondary | ICD-10-CM | POA: Diagnosis not present

## 2018-11-26 DIAGNOSIS — E1129 Type 2 diabetes mellitus with other diabetic kidney complication: Secondary | ICD-10-CM | POA: Diagnosis not present

## 2018-12-02 DIAGNOSIS — Z1212 Encounter for screening for malignant neoplasm of rectum: Secondary | ICD-10-CM | POA: Diagnosis not present

## 2019-03-31 DIAGNOSIS — I10 Essential (primary) hypertension: Secondary | ICD-10-CM | POA: Diagnosis not present

## 2019-03-31 DIAGNOSIS — E1129 Type 2 diabetes mellitus with other diabetic kidney complication: Secondary | ICD-10-CM | POA: Diagnosis not present

## 2019-03-31 DIAGNOSIS — E785 Hyperlipidemia, unspecified: Secondary | ICD-10-CM | POA: Diagnosis not present

## 2019-03-31 DIAGNOSIS — I2510 Atherosclerotic heart disease of native coronary artery without angina pectoris: Secondary | ICD-10-CM | POA: Diagnosis not present

## 2019-05-17 DIAGNOSIS — E119 Type 2 diabetes mellitus without complications: Secondary | ICD-10-CM | POA: Diagnosis not present

## 2019-05-17 DIAGNOSIS — H25013 Cortical age-related cataract, bilateral: Secondary | ICD-10-CM | POA: Diagnosis not present

## 2019-05-17 DIAGNOSIS — H40013 Open angle with borderline findings, low risk, bilateral: Secondary | ICD-10-CM | POA: Diagnosis not present

## 2019-05-17 DIAGNOSIS — H2513 Age-related nuclear cataract, bilateral: Secondary | ICD-10-CM | POA: Diagnosis not present

## 2019-07-15 DIAGNOSIS — Z23 Encounter for immunization: Secondary | ICD-10-CM | POA: Diagnosis not present

## 2019-08-25 DIAGNOSIS — E1129 Type 2 diabetes mellitus with other diabetic kidney complication: Secondary | ICD-10-CM | POA: Diagnosis not present

## 2019-08-30 DIAGNOSIS — I10 Essential (primary) hypertension: Secondary | ICD-10-CM | POA: Diagnosis not present

## 2019-08-30 DIAGNOSIS — E1129 Type 2 diabetes mellitus with other diabetic kidney complication: Secondary | ICD-10-CM | POA: Diagnosis not present

## 2019-08-30 DIAGNOSIS — E785 Hyperlipidemia, unspecified: Secondary | ICD-10-CM | POA: Diagnosis not present

## 2019-08-30 DIAGNOSIS — I2510 Atherosclerotic heart disease of native coronary artery without angina pectoris: Secondary | ICD-10-CM | POA: Diagnosis not present

## 2019-09-17 ENCOUNTER — Other Ambulatory Visit: Payer: Self-pay | Admitting: Physician Assistant

## 2019-09-17 NOTE — Telephone Encounter (Signed)
Rx request sent to pharmacy.  

## 2019-10-05 ENCOUNTER — Other Ambulatory Visit: Payer: Self-pay

## 2019-10-05 MED ORDER — METOPROLOL SUCCINATE ER 100 MG PO TB24: 100.0000 mg | ORAL_TABLET | Freq: Every day | ORAL | 0 refills | Status: DC

## 2019-10-27 DIAGNOSIS — Z20828 Contact with and (suspected) exposure to other viral communicable diseases: Secondary | ICD-10-CM | POA: Diagnosis not present

## 2019-10-27 DIAGNOSIS — Z03818 Encounter for observation for suspected exposure to other biological agents ruled out: Secondary | ICD-10-CM | POA: Diagnosis not present

## 2019-10-31 NOTE — Progress Notes (Signed)
Cardiology Office Note:    Date:  11/02/2019   ID:  Peter Ford., DOB 1956-02-24, MRN [id number]  PCP:  Peter Bunting, MD  Cardiologist:  Peter Martinique, MD  Electrophysiologist:  None   Referring MD: Peter Bunting, MD   Chief Complaint: follow-up of CAD  History of Present Illness:    Peter Saka. is a 63 y.o. male with a history of CAD with lateral MI in 2008 s/p emergent stenting of LCX with BMS and normal Myoview in 2014 and again in 2017, hypertension, hyperlipidemia, type 2 diabetes mellitus, and obstructive sleep apnea on CPAP who is followed by Peter. Martinique.   Patient has done well from a cardiac standpoint since his MI in 2008. He has had 2 normal nuclear stress tests since then, one in 2014 and most recent one in 2017.  I last saw the patient in 09/2018 at which time he was doing very well from a cardiac standpoint. No medication changes were made and he was advised to follow-up in 1 year.   He presents today for cardiology follow-up.  He denies any recent chest pain or shortness of breath.  He has not been exercising  much.  He does admit he has lost quite a significant amount of weight since earlier this year.  He his sleep pattern is very poor as well.  He attributed to his sleep later than usual.  He does not have any lower extremity edema, orthopnea or PND.  He was taken off of ramipril due to renal issues when he had a kidney stone.  His blood pressure on the current medication is very well controlled.  Heart rate is borderline low today however patient is completely asymptomatic.  He says usually his heart rate is in the 50s.  I recommended continue on the current medication and if his heart rate is persistently in the 40s or if he is symptomatic with dizziness or excessive fatigue, we can consider decrease metoprolol succinate to 50 mg daily.    Past Medical History:  Diagnosis Date  . Allergy   . Arthritis    joints- in general   . Coronary artery disease    . Diabetes mellitus without complication (HCC)    Hgb A1c- has lately reduced to 5.6, per management by Peter. Reynaldo Gray, has lost 30 +lbs.  . Encounter for blood transfusion    as a newborn, on two occasions had bld. transfusion  . GERD (gastroesophageal reflux disease)   . Gout   . Hepatitis   . Hyperlipidemia   . Hypertension   . Myocardial infarction ([place]) 2008  . Neuromuscular disorder ([place])    funny sensations in both feet on & off, nerves chronically traumatized with problemed feet    . Obesity   . Sleep apnea    uses CPAP- last study- long ago    Past Surgical History:  Procedure Laterality Date  . CORONARY ANGIOPLASTY WITH STENT PLACEMENT    . dental implant    . ELBOW SURGERY Right 1984   bone spur removed   . INSERTION OF MESH N/A 08/31/2014   Procedure: INSERTION OF MESH;  Surgeon: Peter Mesa, MD;  Location: [facility];  Service: General;  Laterality: N/A;  . JOINT REPLACEMENT Left 2010   hip  . SIGMOIDOSCOPY     30 yrs ago   . UMBILICAL HERNIA REPAIR  08/31/2014   WITH [place]         Peter Gray  . UMBILICAL HERNIA REPAIR  N/A 08/31/2014   Procedure: HERNIA REPAIR UMBILICAL ADULT;  Surgeon: Peter Mesa, MD;  Location: [facility];  Service: General;  Laterality: N/A;  . VASECTOMY      Current Medications: Current Meds  Medication Sig  . aspirin EC 81 MG tablet Take 81 mg by mouth daily.  Marland Kitchen atorvastatin (LIPITOR) 80 MG tablet TAKE 1 TABLET(80 MG) BY MOUTH DAILY  . cetirizine (ZYRTEC) 10 MG tablet Take 10 mg by mouth at bedtime.   . famotidine (PEPCID) 20 MG tablet Take 20 mg by mouth 2 (two) times daily.  . indomethacin (INDOCIN SR) 75 MG CR capsule Take 75 mg by mouth 3 (three) times daily as needed for mild pain.   Marland Kitchen JARDIANCE 10 MG TABS tablet   . metFORMIN (GLUCOPHAGE) 500 MG tablet Take 500 mg by mouth 2 (two) times daily with a meal.   . metoprolol succinate (TOPROL-XL) 100 MG 24 hr tablet Take 1 tablet (100 mg total) by mouth daily. Take with or immediately following  a meal.  . Multiple Vitamin (MULTIVITAMIN) tablet Take 1 tablet by mouth daily.  . nitroGLYCERIN (NITROSTAT) 0.4 MG SL tablet Place 1 tablet (0.4 mg total) under the tongue every 5 (five) minutes as needed for chest pain.  Marland Kitchen omega-3 acid ethyl esters (LOVAZA) 1 g capsule Take 4 capsules (4 g total) by mouth daily.  . [DISCONTINUED] ramipril (ALTACE) 2.5 MG capsule Take 1 capsule (2.5 mg total) by mouth daily.   Current Facility-Administered Medications for the 11/02/19 encounter (Office Visit) with Peter Deforest, PA  Medication  . 0.9 %  sodium chloride infusion     Allergies:   Bee venom   Social History   Socioeconomic History  . Marital status: Married    Spouse name: Not on file  . Number of children: Not on file  . Years of education: Not on file  . Highest education level: Not on file  Occupational History  . Not on file  Tobacco Use  . Smoking status: Never Smoker  . Smokeless tobacco: Never Used  Substance and Sexual Activity  . Alcohol use: Yes    Comment: occas,- less than social   . Drug use: No  . Sexual activity: Yes    Birth control/protection: Surgical  Other Topics Concern  . Not on file  Social History Narrative  . Not on file   Social Determinants of Health   Financial Resource Strain:   . Difficulty of Paying Living Expenses: Not on file  Food Insecurity:   . Worried About Charity fundraiser in the Last Year: Not on file  . Ran Out of Food in the Last Year: Not on file  Transportation Needs:   . Lack of Transportation (Medical): Not on file  . Lack of Transportation (Non-Medical): Not on file  Physical Activity:   . Days of Exercise per Week: Not on file  . Minutes of Exercise per Session: Not on file  Stress:   . Feeling of Stress : Not on file  Social Connections:   . Frequency of Communication with Friends and Family: Not on file  . Frequency of Social Gatherings with Friends and Family: Not on file  . Attends Religious Services: Not on file   . Active Member of Clubs or Organizations: Not on file  . Attends Archivist Meetings: Not on file  . Marital Status: Not on file     Family History: The patient's family history includes Breast cancer in his mother; Coronary  artery disease in his father and mother; Heart failure in his father; Other in his father. There is no history of Colon cancer or Colon polyps.  ROS:   Please see the history of present illness.    All other systems reviewed and are negative.  EKGs/Labs/Other Studies Reviewed:    The following studies were reviewed today:  Cardiac Catheterization 02/13/2007: Angiographic Data:  The left coronary artery arises and distributes  normally.  The left main coronary artery appears normal.   The left anterior descending artery has scattered mild narrowings in the  proximal mid vessel up to 20%.   The left circumflex coronary is a large vessel which is occluded  proximally.  There are right-to-left collaterals to the circumflex from  the right coronary.   The right coronary is a large, dominant vessel.  It has diffuse disease  with mild narrowings in the proximal, mid, and distal vessel up to 20%.   The left ventricular angiography was performed in the procedure.  This  was performed in both the RAO and LAO cranial views.  This demonstrates  mild left ventricular enlargement.  There is hypokinesia of the inferior  and lateral wall with overall mild left ventricular dysfunction.  Ejection fraction is estimated at 45%.  There is no mitral regurgitation  or prolapse.   We proceeded with acute intervention of the left circumflex coronary  artery.  We were able to cross the lesion without difficulty with the  wire.  This demonstrated a large terminal circumflex with two large  marginal vessels.  The lesion appeared to terminate before the takeoff  of the first marginal vessel.  This lesion was predilated using a 3.0 x  15 mm Maverick balloon up to 6  atmospheres.  We then deployed the 3.5 x  16 mm Liberte stent across the lesion, dilating to 9 atmospheres and  then 12 atmospheres with the stent balloon.  We then postdilated the  entire segment of the stent with a 3.5 x 12 mm Quantum Maverick balloon,  dilating up to 14 atmospheres x2.  This yielded an excellent  angiographic result with 0% residual stenosis and TIMI grade 3 flow.  The patient remained hemodynamically stable throughout.  Final Interpretation:  1. Single-vessel occlusive atherosclerotic coronary artery disease.  2. Mild left ventricular dysfunction.  3. Successful intracoronary stenting of the left circumflex coronary. _______________  Exercise Myoview 05/24/2016:  Nuclear stress EF: 43%. Visually , the EF appears to be greater than the computer generated EF of 43%. The EF is 50-55% visually  There was no ST segment deviation noted during stress.  The study is normal.  This is a low risk study.  EKG:  EKG ordered today. EKG personally reviewed and demonstrates sinus bradycardia, heart rate 48, otherwise no significant ST-T wave changes.  Recent Labs: No results found for requested labs within last 8760 hours.  Recent Lipid Panel    Component Value Date/Time   CHOL 127 09/23/2018 1217   TRIG 95 09/23/2018 1217   HDL 42 09/23/2018 1217   CHOLHDL 4 01/28/2014 1232   VLDL 29.8 01/28/2014 1232   LDLCALC 66 09/23/2018 1217    Physical Exam:    Vital Signs: BP 124/72   Pulse (!) 48   Ht 6\' 3"  (1.905 m)   Wt 218 lb (98.9 kg)   SpO2 99%   BMI 27.25 kg/m     Wt Readings from Last 3 Encounters:  11/02/19 218 lb (98.9 kg)  09/24/18 253 lb  6.4 oz (114.9 kg)  08/22/17 274 lb 12.8 oz (124.6 kg)     General: 63 y.o. male in no acute distress. HEENT: Normocephalic and atraumatic. Sclera clear. EOMs intact. Neck: Supple. No carotid bruits. No JVD. Heart:  RRR. Distinct S1 and S2. No murmurs, gallops, or rubs. Radial and distal pedal pulses 2+ and equal  bilaterally. Lungs: No increased work of breathing. Clear to ausculation bilaterally. No wheezes, rhonchi, or rales.  Abdomen: Soft, non-distended, and non-tender to palpation. Bowel sounds present in all 4 quadrants.  MSK: Normal strength and tone for age.  Extremities: No lower extremity edema.    Skin: Warm and dry. Neuro: Alert and oriented x3. No focal deficits. Psych: Normal affect. Responds appropriately.   Assessment:    1. Coronary artery disease involving native coronary artery of native heart without angina pectoris   2. Essential hypertension   3. Hyperlipidemia LDL goal <70   4. Controlled type 2 diabetes mellitus without complication, without long-term current use of insulin ([place])   5. OSA (obstructive sleep apnea)   6. Bradycardia     Plan:    1.  CAD: Denies any recent chest pain.  Continue Lipitor and aspirin  2. Asymptomatic bradycardia: Heart rate 48 on today's EKG, normally his heart rate is in the 50s at home.  He is completely asymptomatic without any excessive fatigue, dizziness, blurred vision or feeling of passing out.  We will continue on the current dose of Toprol-XL.  If his heart rate is persistently in the 40s at home or if he began to become symptomatic, we will consider decrease Toprol-XL to 50 mg daily.  3. Hypertension: Continue on current therapy.  Blood pressure well controlled.  4.  Hyperlipidemia: On Lipitor 80 mg daily.  Will defer annual lipid panel to primary care provider  5. DM2: Managed by primary care provider  6. Obstructive sleep apnea: No significant issue recently.   Disposition: Follow up in 1 year   Medication Adjustments/Labs and Tests Ordered: Current medicines are reviewed at length with the patient today.  Concerns regarding medicines are outlined above.  Orders Placed This Encounter  Procedures  . EKG 12-Lead   No orders of the defined types were placed in this encounter.   Patient Instructions  Medication  Instructions:  Peter Deforest, PA recommends that you continue on your current medications as directed. Please refer to the Current Medication list given to you today.  *If you need a refill on your cardiac medications before your next appointment, please call your pharmacy*  Follow-Up: At [facility], you and your health needs are our priority.  As part of our continuing mission to provide you with exceptional heart care, we have created designated Provider Care Teams.  These Care Teams include your primary Cardiologist (physician) and Advanced Practice Providers (APPs -  Physician Assistants and Nurse Practitioners) who all work together to provide you with the care you need, when you need it.  Your next appointment:   12 month(s)  The format for your next appointment:   In Person  Provider:   You may see Peter Martinique, MD or one of the following Advanced Practice Providers on your designated Care Team:    Peter Deforest, PA-C  Fabian Sharp, PA-C or   Roby Lofts, PA-C    Signed, [place], [place]  11/02/2019 1:11 PM    [place]

## 2019-11-02 ENCOUNTER — Encounter: Payer: Self-pay | Admitting: Physician Assistant

## 2019-11-02 ENCOUNTER — Ambulatory Visit: Payer: BC Managed Care – PPO | Admitting: Physician Assistant

## 2019-11-02 ENCOUNTER — Other Ambulatory Visit: Payer: Self-pay

## 2019-11-02 VITALS — BP 124/72 | HR 48 | Ht 75.0 in | Wt 218.0 lb

## 2019-11-02 DIAGNOSIS — I10 Essential (primary) hypertension: Secondary | ICD-10-CM

## 2019-11-02 DIAGNOSIS — E785 Hyperlipidemia, unspecified: Secondary | ICD-10-CM | POA: Diagnosis not present

## 2019-11-02 DIAGNOSIS — I2510 Atherosclerotic heart disease of native coronary artery without angina pectoris: Secondary | ICD-10-CM | POA: Diagnosis not present

## 2019-11-02 DIAGNOSIS — E119 Type 2 diabetes mellitus without complications: Secondary | ICD-10-CM

## 2019-11-02 DIAGNOSIS — R001 Bradycardia, unspecified: Secondary | ICD-10-CM

## 2019-11-02 DIAGNOSIS — G4733 Obstructive sleep apnea (adult) (pediatric): Secondary | ICD-10-CM

## 2019-11-02 NOTE — Patient Instructions (Signed)
Medication Instructions:  Almyra Deforest, PA recommends that you continue on your current medications as directed. Please refer to the Current Medication list given to you today.  *If you need a refill on your cardiac medications before your next appointment, please call your pharmacy*  Follow-Up: At [facility], you and your health needs are our priority.  As part of our continuing mission to provide you with exceptional heart care, we have created designated Provider Care Teams.  These Care Teams include your primary Cardiologist (physician) and Advanced Practice Providers (APPs -  Physician Assistants and Nurse Practitioners) who all work together to provide you with the care you need, when you need it.  Your next appointment:   12 month(s)  The format for your next appointment:   In Person  Provider:   You may see Peter Martinique, MD or one of the following Advanced Practice Providers on your designated Care Team:    Almyra Deforest, PA-C  Fabian Sharp, PA-C or   Roby Lofts, [place]

## 2019-12-21 ENCOUNTER — Other Ambulatory Visit: Payer: Self-pay | Admitting: Physician Assistant

## 2019-12-21 NOTE — Telephone Encounter (Signed)
Rx has been sent to the pharmacy electronically. ° °

## 2019-12-22 DIAGNOSIS — Z0000 Encounter for general adult medical examination without abnormal findings: Secondary | ICD-10-CM | POA: Diagnosis not present

## 2019-12-22 DIAGNOSIS — Z125 Encounter for screening for malignant neoplasm of prostate: Secondary | ICD-10-CM | POA: Diagnosis not present

## 2019-12-22 DIAGNOSIS — E7849 Other hyperlipidemia: Secondary | ICD-10-CM | POA: Diagnosis not present

## 2019-12-22 DIAGNOSIS — E1129 Type 2 diabetes mellitus with other diabetic kidney complication: Secondary | ICD-10-CM | POA: Diagnosis not present

## 2019-12-24 DIAGNOSIS — R82998 Other abnormal findings in urine: Secondary | ICD-10-CM | POA: Diagnosis not present

## 2019-12-27 DIAGNOSIS — E1129 Type 2 diabetes mellitus with other diabetic kidney complication: Secondary | ICD-10-CM | POA: Diagnosis not present

## 2019-12-27 DIAGNOSIS — G4733 Obstructive sleep apnea (adult) (pediatric): Secondary | ICD-10-CM | POA: Diagnosis not present

## 2019-12-27 DIAGNOSIS — I2510 Atherosclerotic heart disease of native coronary artery without angina pectoris: Secondary | ICD-10-CM | POA: Diagnosis not present

## 2019-12-27 DIAGNOSIS — Z1331 Encounter for screening for depression: Secondary | ICD-10-CM | POA: Diagnosis not present

## 2019-12-27 DIAGNOSIS — Z0000 Encounter for general adult medical examination without abnormal findings: Secondary | ICD-10-CM | POA: Diagnosis not present

## 2019-12-27 DIAGNOSIS — E785 Hyperlipidemia, unspecified: Secondary | ICD-10-CM | POA: Diagnosis not present

## 2019-12-29 DIAGNOSIS — Z1212 Encounter for screening for malignant neoplasm of rectum: Secondary | ICD-10-CM | POA: Diagnosis not present

## 2020-03-07 ENCOUNTER — Other Ambulatory Visit: Payer: Self-pay | Admitting: Cardiology

## 2020-03-07 MED ORDER — ATORVASTATIN CALCIUM 80 MG PO TABS: ORAL_TABLET | ORAL | 2 refills | Status: DC

## 2020-03-07 MED ORDER — METOPROLOL SUCCINATE ER 100 MG PO TB24: ORAL_TABLET | ORAL | 2 refills | Status: DC

## 2020-03-07 NOTE — Telephone Encounter (Signed)
Rx(s) sent to pharmacy electronically.  

## 2020-03-07 NOTE — Telephone Encounter (Signed)
New message   *STAT* If patient is at the pharmacy, call can be transferred to refill team.   1. Which medications need to be refilled? (please list name of each medication and dose if known)  atorvastatin (LIPITOR) 80 MG tablet metoprolol succinate (TOPROL-XL) 100 MG 24 hr tablet   2. Which pharmacy/location (including street and city if local pharmacy) is medication to be sent to? [facility], [place] - [address] AT [employer]  3. Do they need a 30 day or 90 day supply? 90 day

## 2020-04-12 DIAGNOSIS — I129 Hypertensive chronic kidney disease with stage 1 through stage 4 chronic kidney disease, or unspecified chronic kidney disease: Secondary | ICD-10-CM | POA: Diagnosis not present

## 2020-04-12 DIAGNOSIS — E1129 Type 2 diabetes mellitus with other diabetic kidney complication: Secondary | ICD-10-CM | POA: Diagnosis not present

## 2020-04-12 DIAGNOSIS — N1831 Chronic kidney disease, stage 3a: Secondary | ICD-10-CM | POA: Diagnosis not present

## 2020-04-12 DIAGNOSIS — E669 Obesity, unspecified: Secondary | ICD-10-CM | POA: Diagnosis not present

## 2020-04-21 DIAGNOSIS — M79675 Pain in left toe(s): Secondary | ICD-10-CM | POA: Diagnosis not present

## 2020-04-21 DIAGNOSIS — L03032 Cellulitis of left toe: Secondary | ICD-10-CM | POA: Diagnosis not present

## 2020-04-21 DIAGNOSIS — I10 Essential (primary) hypertension: Secondary | ICD-10-CM | POA: Diagnosis not present

## 2020-08-14 DIAGNOSIS — I10 Essential (primary) hypertension: Secondary | ICD-10-CM | POA: Diagnosis not present

## 2020-08-14 DIAGNOSIS — E1129 Type 2 diabetes mellitus with other diabetic kidney complication: Secondary | ICD-10-CM | POA: Diagnosis not present

## 2020-11-19 ENCOUNTER — Other Ambulatory Visit: Payer: Self-pay | Admitting: Cardiology

## 2020-12-07 NOTE — Progress Notes (Signed)
Virtual Visit via Telephone Note   This visit type was conducted due to national recommendations for restrictions regarding the COVID-19 Pandemic (e.g. social distancing) in an effort to limit this patient's exposure and mitigate transmission in our community.  Due to his co-morbid illnesses, this patient is at least at moderate risk for complications without adequate follow up.  This format is felt to be most appropriate for this patient at this time.  The patient did not have access to video technology/had technical difficulties with video requiring transitioning to audio format only (telephone).  All issues noted in this document were discussed and addressed.  No physical exam could be performed with this format.  Please refer to the patient's chart for his  consent to telehealth for [facility].    Date:  12/08/2020   ID:  Peter Gray., DOB 09-16-1956, MRN [id number] The patient was identified using 2 identifiers.  Patient Location: Home Provider Location: Home Office  PCP:  [name], MD  Cardiologist:  Pedro Whiters Martinique, MD  Electrophysiologist:  None   Evaluation Performed:  Follow-Up Visit  Chief Complaint:  CAD  History of Present Illness:    Peter Gray. is a 65 y.o. male with  a history of CAD with lateral MI in 2008 s/p emergent stenting of LCX with BMS and normal Myoview in 2014 and again in 2017, hypertension, hyperlipidemia, type 2 diabetes mellitus, and obstructive sleep apnea on CPAP.  Patient has done well from a cardiac standpoint since his MI in 2008. He has had 2 normal nuclear stress tests since then, one in 2014 and most recent one in 2017.   He did lose a lot of weight this year. Probably about 50 lbs. He is exercising regularly. He denies any chest pain or dyspnea. Feels health is good.   The patient does not have symptoms concerning for COVID-19 infection (fever, chills, cough, or new shortness of breath).    Past Medical History:   Diagnosis Date  . Allergy   . Arthritis    joints- in general   . Coronary artery disease   . Diabetes mellitus without complication (HCC)    Hgb A1c- has lately reduced to 5.6, per management by Dr. Reynaldo Minium, has lost 30 +lbs.  . Encounter for blood transfusion    as a newborn, on two occasions had bld. transfusion  . GERD (gastroesophageal reflux disease)   . Gout   . Hepatitis   . Hyperlipidemia   . Hypertension   . Myocardial infarction ([place]) 2008  . Neuromuscular disorder ([place])    funny sensations in both feet on & off, nerves chronically traumatized with problemed feet    . Obesity   . Sleep apnea    uses CPAP- last study- long ago   Past Surgical History:  Procedure Laterality Date  . CORONARY ANGIOPLASTY WITH STENT PLACEMENT    . dental implant    . ELBOW SURGERY Right 1984   bone spur removed   . INSERTION OF MESH N/A 08/31/2014   Procedure: INSERTION OF MESH;  Surgeon: Donnie Mesa, MD;  Location: [place];  Service: General;  Laterality: N/A;  . JOINT REPLACEMENT Left 2010   hip  . SIGMOIDOSCOPY     30 yrs ago   . UMBILICAL HERNIA REPAIR  08/31/2014   WITH [place]         DR TSUEI  . UMBILICAL HERNIA REPAIR N/A 08/31/2014   Procedure: HERNIA REPAIR UMBILICAL ADULT;  Surgeon: Donnie Mesa,  MD;  Location: [place];  Service: General;  Laterality: N/A;  . VASECTOMY       Current Meds  Medication Sig  . aspirin EC 81 MG tablet Take 81 mg by mouth daily.  Marland Kitchen atorvastatin (LIPITOR) 80 MG tablet TAKE 1 TABLET(80 MG) BY MOUTH DAILY  . cetirizine (ZYRTEC) 10 MG tablet Take 10 mg by mouth at bedtime.   . Cholecalciferol (VITAMIN D) 50 MCG (2000 UT) tablet Take 2000 IU twice a day  . empagliflozin (JARDIANCE) 10 MG TABS tablet Take 1 tablet (10 mg total) by mouth daily before breakfast.  . famotidine (PEPCID) 20 MG tablet Take 20 mg by mouth 2 (two) times daily.  Marland Kitchen icosapent Ethyl (VASCEPA) 1 g capsule Take 2 capsules twice a day  . indomethacin (INDOCIN SR) 75 MG CR capsule  Take 75 mg by mouth 3 (three) times daily as needed for mild pain.   . metFORMIN (GLUCOPHAGE) 500 MG tablet Take 500 mg by mouth 2 (two) times daily with a meal.   . metoprolol succinate (TOPROL-XL) 100 MG 24 hr tablet TAKE 1 TABLET BY MOUTH DAILY WITH FOOD OR IMMEDIATELY FOLLOWING A MEAL  . Multiple Vitamin (MULTIVITAMIN) tablet Take 1 tablet by mouth daily.  . nitroGLYCERIN (NITROSTAT) 0.4 MG SL tablet Place 1 tablet (0.4 mg total) under the tongue every 5 (five) minutes as needed for chest pain.  . [DISCONTINUED] JARDIANCE 10 MG TABS tablet   . [DISCONTINUED] omega-3 acid ethyl esters (LOVAZA) 1 g capsule Take 4 capsules (4 g total) by mouth daily.   Current Facility-Administered Medications for the 12/08/20 encounter (Video Visit) with Martinique, Brock Mokry M, MD  Medication  . 0.9 %  sodium chloride infusion     Allergies:   Bee venom   Social History   Tobacco Use  . Smoking status: Never Smoker  . Smokeless tobacco: Never Used  Substance Use Topics  . Alcohol use: Yes    Comment: occas,- less than social   . Drug use: No     Family Hx: The patient's family history includes Breast cancer in his mother; Coronary artery disease in his father and mother; Heart failure in his father; Other in his father. There is no history of Colon cancer or Colon polyps.  ROS:   Please see the history of present illness.    All other systems reviewed and are negative.   Prior CV studies:   The following studies were reviewed today:  Cardiac Catheterization 02/13/2007: Angiographic Data:  The left coronary artery arises and distributes normally. The left main coronary artery appears normal.  The left anterior descending artery has scattered mild narrowings in the proximal mid vessel up to 20%.  The left circumflex coronary is a large vessel which is occluded proximally. There are right-to-left collaterals to the circumflex from the right coronary.  The right coronary is a large,  dominant vessel. It has diffuse disease with mild narrowings in the proximal, mid, and distal vessel up to 20%.  The left ventricular angiography was performed in the procedure. This was performed in both the RAO and LAO cranial views. This demonstrates mild left ventricular enlargement. There is hypokinesia of the inferior and lateral wall with overall mild left ventricular dysfunction. Ejection fraction is estimated at 45%. There is no mitral regurgitation or prolapse.  We proceeded with acute intervention of the left circumflex coronary artery. We were able to cross the lesion without difficulty with the wire. This demonstrated a large terminal circumflex with two  large marginal vessels. The lesion appeared to terminate before the takeoff of the first marginal vessel. This lesion was predilated using a 3.0 x 15 mm Maverick balloon up to 6 atmospheres. We then deployed the 3.5 x 16 mm Liberte stent across the lesion, dilating to 9 atmospheres and then 12 atmospheres with the stent balloon. We then postdilated the entire segment of the stent with a 3.5 x 12 mm Quantum Maverick balloon, dilating up to 14 atmospheres x2. This yielded an excellent angiographic result with 0% residual stenosis and TIMI grade 3 flow. The patient remained hemodynamically stable throughout.  Final Interpretation: 1. Single-vessel occlusive atherosclerotic coronary artery disease. 2. Mild left ventricular dysfunction. 3. Successful intracoronary stenting of the left circumflex coronary. _______________  Exercise Myoview 05/24/2016:  Nuclear stress EF: 43%. Visually , the EF appears to be greater than the computer generated EF of 43%. The EF is 50-55% visually  There was no ST segment deviation noted during stress.  The study is normal.  This is a low risk study.   Labs/Other Tests and Data Reviewed:    EKG:  No ECG reviewed.  Recent Labs: No results found  for requested labs within last 8760 hours.   Recent Lipid Panel Lab Results  Component Value Date/Time   CHOL 127 09/23/2018 12:17 PM   TRIG 95 09/23/2018 12:17 PM   HDL 42 09/23/2018 12:17 PM   CHOLHDL 4 01/28/2014 12:32 PM   [place] 66 09/23/2018 12:17 PM   Labs dated 12/22/19: cholesterol 113, triglycerides 72, HDL 41, LDL 58. Normal ALT and TSH.  Dated 04/24/20: creatinine 1.3. potassium 5. Hgb normal. A1c 5.7%.    Wt Readings from Last 3 Encounters:  12/08/20 225 lb (102.1 kg)  11/02/19 218 lb (98.9 kg)  09/24/18 253 lb 6.4 oz (114.9 kg)     Risk Assessment/Calculations:      Objective:    Vital Signs:  BP 118/64   Pulse (!) 53   Ht 6' 2.5" (1.892 m)   Wt 225 lb (102.1 kg)   BMI 28.50 kg/m    VITAL SIGNS:  reviewed  ASSESSMENT & PLAN:    1.  CAD: Denies any recent chest pain.  Continue Lipitor and aspirin. Last Myoview in 2017 was normal. May consider follow up stress testing next visit.   2. Asymptomatic bradycardia: Heart rate in 50s. Can get HR up to 120-130 on elliptical   3. Hypertension: Continue on current therapy.  Blood pressure well controlled.  4.  Hyperlipidemia: On Lipitor 80 mg daily.  last lipids excellent.   5. DM2: Managed by primary care provider. Last A1c 5.7%. significant improvement with weight loss and Jardiance.   6. Obstructive sleep apnea: uses dental appliance. Improved with weight loss.         COVID-19 Education: The signs and symptoms of COVID-19 were discussed with the patient and how to seek care for testing (follow up with PCP or arrange E-visit).  The importance of social distancing was discussed today.  Time:   Today, I have spent 15 minutes with the patient with telehealth technology discussing the above problems.     Medication Adjustments/Labs and Tests Ordered: Current medicines are reviewed at length with the patient today.  Concerns regarding medicines are outlined above.   Tests Ordered: No orders of the  defined types were placed in this encounter.   Medication Changes: No orders of the defined types were placed in this encounter.   Follow Up:  In Person in 1  year(s)  Signed, Hassell Patras Martinique, MD  12/08/2020 9:17 AM    [facility]

## 2020-12-08 ENCOUNTER — Telehealth: Payer: Self-pay

## 2020-12-08 ENCOUNTER — Telehealth (INDEPENDENT_AMBULATORY_CARE_PROVIDER_SITE_OTHER): Payer: BC Managed Care – PPO | Admitting: Cardiology

## 2020-12-08 ENCOUNTER — Encounter: Payer: Self-pay | Admitting: Cardiology

## 2020-12-08 VITALS — BP 118/64 | HR 53 | Ht 74.5 in | Wt 225.0 lb

## 2020-12-08 DIAGNOSIS — E119 Type 2 diabetes mellitus without complications: Secondary | ICD-10-CM

## 2020-12-08 DIAGNOSIS — I2510 Atherosclerotic heart disease of native coronary artery without angina pectoris: Secondary | ICD-10-CM | POA: Diagnosis not present

## 2020-12-08 DIAGNOSIS — E785 Hyperlipidemia, unspecified: Secondary | ICD-10-CM

## 2020-12-08 DIAGNOSIS — I10 Essential (primary) hypertension: Secondary | ICD-10-CM

## 2020-12-08 NOTE — Telephone Encounter (Signed)
  Patient Consent for Virtual Visit         Peter Gray. has provided verbal consent on 12/08/2020 for a virtual visit (video or telephone).   CONSENT FOR VIRTUAL VISIT FOR:  Peter Gray.  By participating in this virtual visit I agree to the following:  I hereby voluntarily request, consent and authorize [facility] and its employed or contracted physicians, physician assistants, nurse practitioners or other licensed health care professionals (the Practitioner), to provide me with telemedicine health care services (the "Services") as deemed necessary by the treating Practitioner. I acknowledge and consent to receive the Services by the Practitioner via telemedicine. I understand that the telemedicine visit will involve communicating with the Practitioner through live audiovisual communication technology and the disclosure of certain medical information by electronic transmission. I acknowledge that I have been given the opportunity to request an in-person assessment or other available alternative prior to the telemedicine visit and am voluntarily participating in the telemedicine visit.  I understand that I have the right to withhold or withdraw my consent to the use of telemedicine in the course of my care at any time, without affecting my right to future care or treatment, and that the Practitioner or I may terminate the telemedicine visit at any time. I understand that I have the right to inspect all information obtained and/or recorded in the course of the telemedicine visit and may receive copies of available information for a reasonable fee.  I understand that some of the potential risks of receiving the Services via telemedicine include:  Marland Kitchen Delay or interruption in medical evaluation due to technological equipment failure or disruption; . Information transmitted may not be sufficient (e.g. poor resolution of images) to allow for appropriate medical decision making by the  Practitioner; and/or  . In rare instances, security protocols could fail, causing a breach of personal health information.  Furthermore, I acknowledge that it is my responsibility to provide information about my medical history, conditions and care that is complete and accurate to the best of my ability. I acknowledge that Practitioner's advice, recommendations, and/or decision may be based on factors not within their control, such as incomplete or inaccurate data provided by me or distortions of diagnostic images or specimens that may result from electronic transmissions. I understand that the practice of medicine is not an exact science and that Practitioner makes no warranties or guarantees regarding treatment outcomes. I acknowledge that a copy of this consent can be made available to me via my patient portal ([place] [facility]), or I can request a printed copy by calling the office of [facility].    I understand that my insurance will be billed for this visit.   I have read or had this consent read to me. . I understand the contents of this consent, which adequately explains the benefits and risks of the Services being provided via telemedicine.  . I have been provided ample opportunity to ask questions regarding this consent and the Services and have had my questions answered to my satisfaction. . I give my informed consent for the services to be provided through the use of telemedicine in my medical care

## 2020-12-08 NOTE — Patient Instructions (Signed)
Medication Instructions:  Continue same medications *If you need a refill on your cardiac medications before your next appointment, please call your pharmacy*   Lab Work: None ordered   Testing/Procedures: None ordered   Follow-Up: At [facility], you and your health needs are our priority.  As part of our continuing mission to provide you with exceptional heart care, we have created designated Provider Care Teams.  These Care Teams include your primary Cardiologist (physician) and Advanced Practice Providers (APPs -  Physician Assistants and Nurse Practitioners) who all work together to provide you with the care you need, when you need it.  We recommend signing up for the patient portal called "MyChart".  Sign up information is provided on this After Visit Summary.  MyChart is used to connect with patients for Virtual Visits (Telemedicine).  Patients are able to view lab/test results, encounter notes, upcoming appointments, etc.  Non-urgent messages can be sent to your provider as well.   To learn more about what you can do with MyChart, go to [email or web address].    Your next appointment:  1 year     Call in Oct to schedule Jan appointment    The format for your next appointment: Office    Provider:  Dr.Jordan

## 2021-02-22 DIAGNOSIS — E291 Testicular hypofunction: Secondary | ICD-10-CM | POA: Diagnosis not present

## 2021-03-01 DIAGNOSIS — E291 Testicular hypofunction: Secondary | ICD-10-CM | POA: Diagnosis not present

## 2021-03-20 DIAGNOSIS — H25013 Cortical age-related cataract, bilateral: Secondary | ICD-10-CM | POA: Diagnosis not present

## 2021-03-20 DIAGNOSIS — E119 Type 2 diabetes mellitus without complications: Secondary | ICD-10-CM | POA: Diagnosis not present

## 2021-03-20 DIAGNOSIS — H2513 Age-related nuclear cataract, bilateral: Secondary | ICD-10-CM | POA: Diagnosis not present

## 2021-03-20 DIAGNOSIS — H2511 Age-related nuclear cataract, right eye: Secondary | ICD-10-CM | POA: Diagnosis not present

## 2021-03-20 DIAGNOSIS — H40013 Open angle with borderline findings, low risk, bilateral: Secondary | ICD-10-CM | POA: Diagnosis not present

## 2021-04-04 DIAGNOSIS — H25811 Combined forms of age-related cataract, right eye: Secondary | ICD-10-CM | POA: Diagnosis not present

## 2021-04-04 DIAGNOSIS — H52221 Regular astigmatism, right eye: Secondary | ICD-10-CM | POA: Diagnosis not present

## 2021-04-04 DIAGNOSIS — H2511 Age-related nuclear cataract, right eye: Secondary | ICD-10-CM | POA: Diagnosis not present

## 2021-04-30 DIAGNOSIS — U071 COVID-19: Secondary | ICD-10-CM | POA: Diagnosis not present

## 2021-06-22 DIAGNOSIS — E785 Hyperlipidemia, unspecified: Secondary | ICD-10-CM | POA: Diagnosis not present

## 2021-06-22 DIAGNOSIS — E1165 Type 2 diabetes mellitus with hyperglycemia: Secondary | ICD-10-CM | POA: Diagnosis not present

## 2021-06-22 DIAGNOSIS — Z125 Encounter for screening for malignant neoplasm of prostate: Secondary | ICD-10-CM | POA: Diagnosis not present

## 2021-07-04 DIAGNOSIS — Z1339 Encounter for screening examination for other mental health and behavioral disorders: Secondary | ICD-10-CM | POA: Diagnosis not present

## 2021-07-04 DIAGNOSIS — I10 Essential (primary) hypertension: Secondary | ICD-10-CM | POA: Diagnosis not present

## 2021-07-04 DIAGNOSIS — Z1331 Encounter for screening for depression: Secondary | ICD-10-CM | POA: Diagnosis not present

## 2021-07-04 DIAGNOSIS — Z0000 Encounter for general adult medical examination without abnormal findings: Secondary | ICD-10-CM | POA: Diagnosis not present

## 2021-07-04 DIAGNOSIS — E1129 Type 2 diabetes mellitus with other diabetic kidney complication: Secondary | ICD-10-CM | POA: Diagnosis not present

## 2021-08-23 ENCOUNTER — Other Ambulatory Visit: Payer: Self-pay | Admitting: Cardiology

## 2021-11-20 DIAGNOSIS — E119 Type 2 diabetes mellitus without complications: Secondary | ICD-10-CM | POA: Diagnosis not present

## 2021-11-20 DIAGNOSIS — H25812 Combined forms of age-related cataract, left eye: Secondary | ICD-10-CM | POA: Diagnosis not present

## 2021-11-20 DIAGNOSIS — H43813 Vitreous degeneration, bilateral: Secondary | ICD-10-CM | POA: Diagnosis not present

## 2021-11-20 DIAGNOSIS — H40013 Open angle with borderline findings, low risk, bilateral: Secondary | ICD-10-CM | POA: Diagnosis not present

## 2021-12-04 DIAGNOSIS — H40011 Open angle with borderline findings, low risk, right eye: Secondary | ICD-10-CM | POA: Diagnosis not present

## 2021-12-17 DIAGNOSIS — H40012 Open angle with borderline findings, low risk, left eye: Secondary | ICD-10-CM | POA: Diagnosis not present

## 2022-02-22 DIAGNOSIS — H40013 Open angle with borderline findings, low risk, bilateral: Secondary | ICD-10-CM | POA: Diagnosis not present

## 2022-03-19 ENCOUNTER — Other Ambulatory Visit: Payer: Self-pay | Admitting: Cardiology

## 2022-05-16 ENCOUNTER — Telehealth: Payer: Self-pay | Admitting: Cardiology

## 2022-05-16 NOTE — Telephone Encounter (Signed)
*  STAT* If patient is at the pharmacy, call can be transferred to refill team.   1. Which medications need to be refilled? (please list name of each medication and dose if known) atorvastatin (LIPITOR) 80 MG tablet  2. Which pharmacy/location (including street and city if local pharmacy) is medication to be sent to? [place], [place] - [address] AT [place]  3. Do they need a 30 day or 90 day supply? 90 day

## 2022-05-17 ENCOUNTER — Other Ambulatory Visit: Payer: Self-pay

## 2022-05-17 MED ORDER — ATORVASTATIN CALCIUM 80 MG PO TABS: ORAL_TABLET | ORAL | 0 refills | Status: DC

## 2022-05-17 NOTE — Telephone Encounter (Signed)
Medication refilled and sent to desired pharmacy. Was not able to get hold of the patient.

## 2022-06-12 ENCOUNTER — Other Ambulatory Visit: Payer: Self-pay | Admitting: Cardiology

## 2022-06-12 NOTE — Telephone Encounter (Signed)
Rx(s) sent to pharmacy electronically.  

## 2022-06-17 NOTE — Progress Notes (Unsigned)
Cardiology Office Note:    Date:  06/18/2022   ID:  Peter Ford., DOB 11/06/1956, MRN [id number]  PCP:  [name], MD   [place] Prov[facility] Cardiologist:  Peter Martinique, MD     Referring MD: [name], MD    CC: Here for 1 year follow up visit  History of Present Illness:    Peter Ewen. is a 66 y.o. male with a hx of the following:   HTN CAD, s/p lateral MI in 2008 with emergent stenting of LCX with BMS and normal Myoview in 2014 and in 2017 OSA GERD T2DM CKD  HLD Obesity Gout  He was last seen in the cardiology office by Almyra Deforest, PA-C on November 02, 2019.  He was doing well from a cardiac perspective, but denied exercising much.  Admitted to weight loss and also stated his sleep pattern was poor.  Blood pressure was well controlled at that visit but heart rate was borderline low at 48 bpm.  He was completely asymptomatic with his heart rate.  Said his normal heart rate is usually in the 50s.  It was suggested at that time that if his heart rate was persistently in the 40s at home or if he became symptomatic, Toprol XL dose would be reduced to 50 mg daily.  He denied any chest pain or any other acute cardiac complaints during that visit and he was told to follow-up in 1 year.  He last saw Dr. Collier Salina via video visit on December 08, 2020.  He continued with his weight loss, probably lost about 50 pounds at that time.  He was exercising regularly and denied any acute cardiac complaints and stated he felt good.  He was told to follow-up in 1 year.  Today he presents for his overdue 1 year follow-up.  He is doing well from a cardiac perspective.  He denies any chest pain, shortness of breath, palpitations, syncope, presyncope, dizziness, significant weight change, bleeding, orthopnea, PND, or claudication.  Does have a history of leg swelling in the past, but denies any recent swelling in his bilateral lower extremities.  Says he has not been exercising  like he used to for the past couple of months and is not eating the most consistent heart healthy diet.  Feels like he has gained some weight.  Does report some erectile dysfunction and would like to know if metoprolol is causing this for him.  Also is needing a refill for nitroglycerin.  He works as an Forensic psychologist. Denies any other questions or concerns for today.  Past Medical History:  Diagnosis Date   Allergy    Arthritis    joints- in general    Coronary artery disease    Diabetes mellitus without complication (HCC)    Hgb A1c- has lately reduced to 5.6, per management by Dr. Reynaldo Minium, has lost 30 +lbs.   Encounter for blood transfusion    as a newborn, on two occasions had bld. transfusion   GERD (gastroesophageal reflux disease)    Gout    Hepatitis    Hyperlipidemia    Hypertension    Myocardial infarction [facility]) 2008   Neuromuscular disorder ([place])    funny sensations in both feet on & off, nerves chronically traumatized with problemed feet     Obesity    Sleep apnea    uses CPAP- last study- long ago    Past Surgical History:  Procedure Laterality Date   CORONARY ANGIOPLASTY WITH STENT PLACEMENT  dental implant     ELBOW SURGERY Right 1984   bone spur removed    INSERTION OF MESH N/A 08/31/2014   Procedure: INSERTION OF MESH;  Surgeon: Donnie Mesa, MD;  Location: [place];  Service: General;  Laterality: N/A;   JOINT REPLACEMENT Left 2010   hip   SIGMOIDOSCOPY     30 yrs ago    [place]  08/31/2014   WITH [facility]         DR TSUEI   UMBILICAL HERNIA REPAIR N/A 08/31/2014   Procedure: HERNIA REPAIR UMBILICAL ADULT;  Surgeon: Donnie Mesa, MD;  Location: [facility];  Service: General;  Laterality: N/A;   VASECTOMY      Current Medications: Current Meds  Medication Sig   aspirin EC 81 MG tablet Take 81 mg by mouth daily.   atorvastatin (LIPITOR) 80 MG tablet TAKE ONE TABLET BY MOUTH EVERY DAY   cetirizine (ZYRTEC) 10 MG tablet Take 10 mg by mouth at bedtime.     Cholecalciferol (VITAMIN D) 50 MCG (2000 UT) tablet Take 2000 IU twice a day   empagliflozin (JARDIANCE) 25 MG TABS tablet Take 25 mg by mouth daily before breakfast.   famotidine (PEPCID) 20 MG tablet Take 20 mg by mouth 2 (two) times daily.   icosapent Ethyl (VASCEPA) 1 g capsule Take 2 capsules twice a day   indomethacin (INDOCIN SR) 75 MG CR capsule Take 75 mg by mouth 3 (three) times daily as needed for mild pain.    metFORMIN (GLUCOPHAGE) 500 MG tablet Take 500 mg by mouth 2 (two) times daily with a meal.    Multiple Vitamin (MULTIVITAMIN) tablet Take 1 tablet by mouth daily.   nitroGLYCERIN (NITROSTAT) 0.4 MG SL tablet Place 1 tablet (0.4 mg total) under the tongue every 5 (five) minutes as needed for chest pain.   Testosterone 1.62 % GEL SMARTSIG:40.5 Milligram(s) Topical Every Morning   [DISCONTINUED] metoprolol succinate (TOPROL-XL) 100 MG 24 hr tablet TAKE 1 TABLET BY MOUTH EVERY DAY WITH FOOD OR IMMEDIATELY FOLLOWING A MEAL.   Current Facility-Administered Medications for the 06/18/22 encounter (Office Visit) with Peter Bud, NP  Medication   0.9 %  sodium chloride infusion     Allergies:   Bee venom   Social History   Socioeconomic History   Marital status: Married    Spouse name: Not on file   Number of children: Not on file   Years of education: Not on file   Highest education level: Not on file  Occupational History   Not on file  Tobacco Use   Smoking status: Never   Smokeless tobacco: Never  Substance and Sexual Activity   Alcohol use: Yes    Comment: occas,- less than social    Drug use: No   Sexual activity: Yes    Birth control/protection: Surgical  Other Topics Concern   Not on file  Social History Narrative   Not on file   Social Determinants of Health   Financial Resource Strain: Not on file  Food Insecurity: Not on file  Transportation Needs: Not on file  Physical Activity: Not on file  Stress: Not on file  Social Connections: Not on  file     Family History: The patient's family history includes Breast cancer in his mother; Coronary artery disease in his father and mother; Heart failure in his father; Other in his father. There is no history of Colon cancer or Colon polyps.  ROS:   Review of Systems  Constitutional: Negative.   HENT: Negative.    Eyes: Negative.   Respiratory: Negative.    Cardiovascular:  Positive for leg swelling. Negative for chest pain, palpitations, orthopnea, claudication and PND.       See HPI.   Gastrointestinal: Negative.   Genitourinary: Negative.   Musculoskeletal: Negative.   Skin: Negative.   Neurological: Negative.   Endo/Heme/Allergies: Negative.   Psychiatric/Behavioral: Negative.     Please see the history of present illness.    All other systems reviewed and are negative.  EKGs/Labs/Other Studies Reviewed:    The following studies were reviewed today:   EKG:  EKG is ordered today.  The ekg ordered today demonstrates Sinus bradycardia, 54 bpm, nonspecific ST changes, otherwise no acute changes.   Nuclear medicine stress test on May 24, 2016: Nuclear stress EF: 43%.  Visually, the EF appears to be greater than the computer-generated EF of 43%.  The EF is 50 to 55% visually.  There was no ST segment deviation noted during stress.  The study is normal.  This is a low risk study.  Nuclear medicine stress test on January 21, 2013: Normal stress Myoview.  EF 54%.  Unchanged from 2010.  Recent Labs: No results found for requested labs within last 365 days.  Recent Lipid Panel    Component Value Date/Time   CHOL 127 09/23/2018 1217   TRIG 95 09/23/2018 1217   HDL 42 09/23/2018 1217   CHOLHDL 4 01/28/2014 1232   VLDL 29.8 01/28/2014 1232   LDLCALC 66 09/23/2018 1217         Physical Exam:    VS:  BP 116/74   Pulse (!) 54   Ht '6\' 2"'$  (1.88 m)   Wt 249 lb 3.2 oz (113 kg)   SpO2 96%   BMI 32.00 kg/m     Wt Readings from Last 3 Encounters:  06/18/22 249 lb 3.2 oz  (113 kg)  12/08/20 225 lb (102.1 kg)  11/02/19 218 lb (98.9 kg)     GEN: Well nourished, well developed in no acute distress HEENT: Normal NECK: No JVD; No carotid bruits CARDIAC: S1/S2, slow rate and regular rhythm, no murmurs, rubs, gallops RESPIRATORY:  Clear to auscultation without rales, wheezing or rhonchi  ABDOMEN: Soft, non-tender, non-distended, bowel sounds x4 MUSCULOSKELETAL:  No edema; No deformity  SKIN: Warm and dry NEUROLOGIC:  Alert and oriented x 3 PSYCHIATRIC:  Normal affect   ASSESSMENT:    1. Coronary artery disease of native artery of native heart with stable angina pectoris ([place])   2. Hyperlipidemia, unspecified hyperlipidemia type   3. Hypertension, unspecified type   4. Asymptomatic bradycardia   5. Controlled type 2 diabetes mellitus without complication, without long-term current use of insulin (HCC)    PLAN:    In order of problems listed above:  Coronary artery disease, hyperlipidemia with LDL goal less than 70, s/p lateral MI 2008 with BMS to LCx-chronic, stable Most recent nuclear medicine stress test in 2017 was normal.  Stable with no anginal symptoms. No indication for ischemic evaluation.  We will refill nitroglycerin today.  Continue current medication regimen.  He is due for labs today; however he said he will be seeing his primary care doctor who will be checking all of his labs and is requesting to defer this to his primary care doctor.  I told him that we will let primary care doctor check his lab work and fax [place] over the results.  He verbalized understanding.  Heart healthy  diet and regular cardiovascular exercise encouraged.  2.  Hypertension-chronic, stable Blood pressure stable on exam and patient reports normal readings at home.  We will reduce metoprolol to 50 mg daily to improve complaint of erectile dysfunction. Discussed to monitor BP at home at least 2 hours after medications and sitting for 5-10 minutes.  Discussed to continue  checking blood pressures at home and if systolic blood pressures < 100 or > 140, or he develops symptoms of hypotension, he should let us know.  3. Asymptomatic bradycardia - chronic, stable His last heart rate when he saw Dr. Martinique via video visit in January 2022 was 53.  Heart rate today is 54.  He is asymptomatic with his heart rate, however we will decrease dose of metoprolol to 50 mg daily as mentioned above in #2.  We will continue current medication regimen.  4.  Type 2 diabetes without complication, without any current long-term use of insulin-chronic, stable Last hemoglobin A1c we have on our file was at goal at 5.6% 1 year ago.  PCP to manage.  Continue current medication regimen.  Heart healthy and diabetic diet encouraged along with regular cardiovascular exercise.  5.  Disposition: Follow-up in 1 year or sooner if needed.  Medication Adjustments/Labs and Tests Ordered: Current medicines are reviewed at length with the patient today.  Concerns regarding medicines are outlined above.  Orders Placed This Encounter  Procedures   EKG 12-Lead   Meds ordered this encounter  Medications   metoprolol succinate (TOPROL-XL) 50 MG 24 hr tablet    Sig: Take 1 tablet (50 mg total) by mouth daily. Take with or immediately following a meal.    Dispense:  90 tablet    Refill:  3    New Rx dose change    Patient Instructions  Medication Instructions:  DECREASE Metoprolol Succinate to 50 mg daily   *If you need a refill on your cardiac medications before your next appointment, please call your pharmacy*  Lab Work: NONE ordered at this time of appointment   If you have labs (blood work) drawn today and your tests are completely normal, you will receive your results only by: [place] (if you have MyChart) OR A paper copy in the mail If you have any lab test that is abnormal or we need to change your treatment, we will call you to review the results.  Testing/Procedures: NONE  ordered at this time of appointment   Follow-Up: At [facility], you and your health needs are our priority.  As part of our continuing mission to provide you with exceptional heart care, we have created designated Provider Care Teams.  These Care Teams include your primary Cardiologist (physician) and Advanced Practice Providers (APPs -  Physician Assistants and Nurse Practitioners) who all work together to provide you with the care you need, when you need it.  Your next appointment:   1 year(s)  The format for your next appointment:   In Person  Provider:   Peter Martinique, MD     Other Instructions   Important Information About Sugar         Signed, Peter Bud, NP  06/18/2022 9:23 PM    [place]

## 2022-06-18 ENCOUNTER — Encounter: Payer: Self-pay | Admitting: Nurse Practitioner

## 2022-06-18 ENCOUNTER — Ambulatory Visit (INDEPENDENT_AMBULATORY_CARE_PROVIDER_SITE_OTHER): Payer: Medicare Other | Admitting: Nurse Practitioner

## 2022-06-18 VITALS — BP 116/74 | HR 54 | Ht 74.0 in | Wt 249.2 lb

## 2022-06-18 DIAGNOSIS — R001 Bradycardia, unspecified: Secondary | ICD-10-CM | POA: Diagnosis not present

## 2022-06-18 DIAGNOSIS — G4733 Obstructive sleep apnea (adult) (pediatric): Secondary | ICD-10-CM

## 2022-06-18 DIAGNOSIS — I10 Essential (primary) hypertension: Secondary | ICD-10-CM

## 2022-06-18 DIAGNOSIS — I25118 Atherosclerotic heart disease of native coronary artery with other forms of angina pectoris: Secondary | ICD-10-CM | POA: Diagnosis not present

## 2022-06-18 DIAGNOSIS — E119 Type 2 diabetes mellitus without complications: Secondary | ICD-10-CM

## 2022-06-18 DIAGNOSIS — E785 Hyperlipidemia, unspecified: Secondary | ICD-10-CM | POA: Diagnosis not present

## 2022-06-18 MED ORDER — METOPROLOL SUCCINATE ER 50 MG PO TB24: 50.0000 mg | ORAL_TABLET | Freq: Every day | ORAL | 3 refills | Status: DC

## 2022-06-18 NOTE — Patient Instructions (Signed)
Medication Instructions:  DECREASE Metoprolol Succinate to 50 mg daily   *If you need a refill on your cardiac medications before your next appointment, please call your pharmacy*  Lab Work: NONE ordered at this time of appointment   If you have labs (blood work) drawn today and your tests are completely normal, you will receive your results only by: [place] (if you have MyChart) OR A paper copy in the mail If you have any lab test that is abnormal or we need to change your treatment, we will call you to review the results.  Testing/Procedures: NONE ordered at this time of appointment   Follow-Up: At [facility], you and your health needs are our priority.  As part of our continuing mission to provide you with exceptional heart care, we have created designated Provider Care Teams.  These Care Teams include your primary Cardiologist (physician) and Advanced Practice Providers (APPs -  Physician Assistants and Nurse Practitioners) who all work together to provide you with the care you need, when you need it.  Your next appointment:   1 year(s)  The format for your next appointment:   In Person  Provider:   Peter Martinique, MD     Other Instructions   Important Information About Sugar

## 2022-06-19 ENCOUNTER — Other Ambulatory Visit: Payer: Self-pay | Admitting: Nurse Practitioner

## 2022-06-19 MED ORDER — NITROGLYCERIN 0.4 MG SL SUBL: 0.4000 mg | SUBLINGUAL_TABLET | SUBLINGUAL | 1 refills | Status: DC | PRN

## 2022-06-19 NOTE — Progress Notes (Signed)
He needs another refill of Nitroglycerin and I have sent this to his pharmacy.

## 2022-07-12 ENCOUNTER — Other Ambulatory Visit: Payer: Self-pay | Admitting: Cardiology

## 2022-07-15 ENCOUNTER — Other Ambulatory Visit: Payer: Self-pay | Admitting: Cardiology

## 2023-03-30 ENCOUNTER — Emergency Department (HOSPITAL_BASED_OUTPATIENT_CLINIC_OR_DEPARTMENT_OTHER): Payer: No Typology Code available for payment source

## 2023-03-30 ENCOUNTER — Emergency Department (HOSPITAL_BASED_OUTPATIENT_CLINIC_OR_DEPARTMENT_OTHER)
Admission: EM | Admit: 2023-03-30 | Discharge: 2023-03-30 | Disposition: A | Discharge status: Home / Self Care | Payer: No Typology Code available for payment source | Attending: Emergency Medicine | Admitting: Emergency Medicine

## 2023-03-30 ENCOUNTER — Other Ambulatory Visit: Payer: Self-pay

## 2023-03-30 ENCOUNTER — Encounter (HOSPITAL_BASED_OUTPATIENT_CLINIC_OR_DEPARTMENT_OTHER): Payer: Self-pay | Admitting: Emergency Medicine

## 2023-03-30 DIAGNOSIS — Z7982 Long term (current) use of aspirin: Secondary | ICD-10-CM | POA: Diagnosis not present

## 2023-03-30 DIAGNOSIS — R0789 Other chest pain: Secondary | ICD-10-CM | POA: Diagnosis present

## 2023-03-30 DIAGNOSIS — S2241XA Multiple fractures of ribs, right side, initial encounter for closed fracture: Secondary | ICD-10-CM | POA: Insufficient documentation

## 2023-03-30 DIAGNOSIS — S60012A Contusion of left thumb without damage to nail, initial encounter: Secondary | ICD-10-CM | POA: Insufficient documentation

## 2023-03-30 DIAGNOSIS — S60011A Contusion of right thumb without damage to nail, initial encounter: Secondary | ICD-10-CM | POA: Insufficient documentation

## 2023-03-30 DIAGNOSIS — Y92410 Unspecified street and highway as the place of occurrence of the external cause: Secondary | ICD-10-CM | POA: Insufficient documentation

## 2023-03-30 MED ORDER — TRAMADOL HCL 50 MG PO TABS: 50.0000 mg | ORAL_TABLET | Freq: Four times a day (QID) | ORAL | 0 refills | Status: AC | PRN

## 2023-03-30 MED ORDER — IBUPROFEN 800 MG PO TABS
800.0000 mg | ORAL_TABLET | Freq: Once | ORAL | Status: AC
  Administered 2023-03-30: 800 mg via ORAL
  Filled 2023-03-30: qty 1

## 2023-03-30 MED ORDER — TRAMADOL HCL 50 MG PO TABS
50.0000 mg | ORAL_TABLET | Freq: Once | ORAL | Status: AC
  Administered 2023-03-30: 50 mg via ORAL
  Filled 2023-03-30: qty 1

## 2023-03-30 MED ORDER — METHOCARBAMOL 500 MG PO TABS: 500.0000 mg | ORAL_TABLET | Freq: Four times a day (QID) | ORAL | 0 refills | Status: AC
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
## 2023-03-30 NOTE — ED Notes (Signed)
RT educated pt on proper use of IS w/a set goal of 1500 mLs. Pt able to perform 1850 mLs without difficulty.

## 2023-03-30 NOTE — ED Notes (Signed)
Pt ambulatory to RR w no assistance; gait steady & even
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
## 2023-03-30 NOTE — Discharge Instructions (Addendum)
Return if any problems.

## 2023-05-31 ENCOUNTER — Other Ambulatory Visit: Payer: Self-pay | Admitting: Cardiology

## 2023-06-01 ENCOUNTER — Other Ambulatory Visit: Payer: Self-pay | Admitting: Nurse Practitioner

## 2023-07-21 ENCOUNTER — Telehealth: Payer: Self-pay

## 2023-07-21 NOTE — Telephone Encounter (Signed)
Pt is scheduled for 07/29/23 at 8:50 am with Azalee Course, PA. I will forward clearance to provider.

## 2023-07-21 NOTE — Telephone Encounter (Signed)
   Pre-operative Risk Assessment    Patient Name: Peter Gray.  DOB: [birth year] MRN: [id number]   Last OV 06/19/23 with Francoise Schaumann, NP Next OV None   Request for Surgical Clearance    Procedure:   Right Total Anterior Hip Arthroplasty  Date of Surgery:  Clearance TBD                                 Surgeon:  Gean Birchwood, MD Surgeon's Group or Practice Name:  [facility] Phone number:  [phone]  Fax number:  [phone] ATTN: Sharyn Blitz   Type of Clearance Requested:   - Medical  - Pharmacy:  Hold Aspirin pt will need instructions on when/if to hold   Type of Anesthesia:  General    Additional requests/questions:    SignedZada Finders   07/21/2023, 7:37 AM

## 2023-07-21 NOTE — Telephone Encounter (Signed)
   Name: Peter Gray.  DOB: 02/25/1956  MRN: [id number]  Primary Cardiologist: Peter Swaziland, MD  Chart reviewed as part of pre-operative protocol coverage. Because of Peter SAUCERMAN Jr.'s past medical history and time since last visit, he will require a follow-up in-office visit in order to better assess preoperative cardiovascular risk.Has not been seen since 12/08/2020 with DR. Swaziland and by Sharlene Dory, NP on 06/18/2022.   Pre-op covering staff: - Please schedule appointment and call patient to inform them. If patient already had an upcoming appointment within acceptable timeframe, please add "pre-op clearance" to the appointment notes so provider is aware. - Please contact requesting surgeon's office via preferred method (i.e, phone, fax) to inform them of need for appointment prior to surgery.  This message will also be routed to for input on holding ASA as requested below so that this information is available to the clearing provider at time of patient's appointment.   Joni Reining, NP  07/21/2023, 8:11 AM

## 2023-07-24 NOTE — Progress Notes (Signed)
Cardiology Office Note:  .   Date:  07/25/2023  ID:  Peter Gray., DOB [birth year], MRN [id number] PCP: Geoffry Paradise, MD  [facility] Providers Cardiologist:  Peter Swaziland, MD    History of Present Illness: .   Peter Gray. is a 67 y.o. male with history of CAD (2008 lateral MI with emergent BMS-LCx, normal myoview 2014 and 2017), HTN, OSA, GERD, DM2, CKD, HLD, obesity, gout.   Seen 10/2019 with bradycardia 48 bpm which was asymptomatic. As home heart rates routinely in the 50s, he was recommended to monitor and if BP at home routinely in the 40s to consider reducing Toprol dose. Seen 11/2020 via virtual visit with successful weight loss doing well from cardiac perspective. Last seen 06/18/22 at which time Toprol reduced to 50mg  daily due to ED.   Presents today for preoperative clearance for right total anterior hip arthroplasty with Dr. Turner Daniels. 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 Exercising on the elliptical. Reports no shortness of breath nor dyspnea on exertion. Reports no chest pain, pressure, or tightness. No edema, orthopnea, PND. Reports no palpitations.    ROS: Please see the history of present illness.    All other systems reviewed and are negative.   Studies Reviewed: Marland Kitchen   EKG Interpretation Date/Time:  Friday July 25 2023 08:48:19 EDT Ventricular Rate:  54 PR Interval:  174 QRS Duration:  80 QT Interval:  428 QTC Calculation: 405 R Axis:   23  Text Interpretation: Sinus bradycardia Confirmed by Peter Gray ([id number]) on 07/25/2023 8:51:57 AM    Cardiac Studies & Procedures     STRESS TESTS  MYOCARDIAL PERFUSION IMAGING 05/24/2016  Narrative  Nuclear stress EF: 43%. Visually , the EF appears to be greater than the computer generated EF of 43%. The EF is 50-55% visually  There was no ST segment deviation noted during stress.  The study is normal.  This is a low risk study.              Risk Assessment/Calculations:              Physical Exam:   VS:  BP 120/66   Pulse (!) 54   Ht 6\' 2"  (1.88 m)   Wt 244 lb (110.7 kg)   BMI 31.33 kg/m    Wt Readings from Last 3 Encounters:  07/25/23 244 lb (110.7 kg)  06/18/22 249 lb 3.2 oz (113 kg)  12/08/20 225 lb (102.1 kg)    GEN: Well nourished, well developed in no acute distress NECK: No JVD; No carotid bruits CARDIAC: RRR, no murmurs, rubs, gallops RESPIRATORY:  Clear to auscultation without rales, wheezing or rhonchi  ABDOMEN: Soft, non-tender, non-distended EXTREMITIES:  No edema; No deformity   ASSESSMENT AND PLAN: .    Preop clearance - According to the Revised Cardiac Risk Index (RCRI), his Perioperative Risk of Major Cardiac Event is (%): 0.9. His Functional Capacity in METs is: 7.01 according to the Duke Activity Status Index (DASI). Per AHA/ACC guidelines, he is deemed acceptable risk for the planned procedure without additional cardiovascular testing. Will route to surgical team so they are aware.  Would recommend continuing aspirin throughout the perioperative period but could be held if surgeon needs.  CAD / HLD, LDL goal <70 - Stable with no anginal symptoms. No indication for ischemic evaluation.  07/2023 LDL 73. Continue GDMT Atorvastatin, Aspirin, Metoprolol, Vascepa. Heart healthy diet and regular cardiovascular exercise encouraged.  Nitroglycerin refill provided.  HTN - BP well controlled. Continue current antihypertensive regimen.    Bradycardia - Longstanding history likely caused by Metoprolol. Asymptomatic with no lightheadedness, dizziness. Continue present Metoprolol 50mg  dosing. Could decrease in the future if he develops symptomatic bradycardia.   OSA - CPAP compliance encouraged.       Dispo: follow up in 1 year with Dr. Swaziland  Signed, [name], NP

## 2023-07-25 ENCOUNTER — Ambulatory Visit (INDEPENDENT_AMBULATORY_CARE_PROVIDER_SITE_OTHER): Payer: Medicare Other | Admitting: Family

## 2023-07-25 ENCOUNTER — Encounter (HOSPITAL_BASED_OUTPATIENT_CLINIC_OR_DEPARTMENT_OTHER): Payer: Self-pay | Admitting: Family

## 2023-07-25 VITALS — BP 120/66 | HR 54 | Ht 74.0 in | Wt 244.0 lb

## 2023-07-25 DIAGNOSIS — I25118 Atherosclerotic heart disease of native coronary artery with other forms of angina pectoris: Secondary | ICD-10-CM

## 2023-07-25 DIAGNOSIS — Z01818 Encounter for other preprocedural examination: Secondary | ICD-10-CM

## 2023-07-25 DIAGNOSIS — E785 Hyperlipidemia, unspecified: Secondary | ICD-10-CM | POA: Diagnosis not present

## 2023-07-25 DIAGNOSIS — G4733 Obstructive sleep apnea (adult) (pediatric): Secondary | ICD-10-CM | POA: Diagnosis not present

## 2023-07-25 DIAGNOSIS — I10 Essential (primary) hypertension: Secondary | ICD-10-CM

## 2023-07-25 MED ORDER — NITROGLYCERIN 0.4 MG SL SUBL
0.4000 mg | SUBLINGUAL_TABLET | SUBLINGUAL | 1 refills | Status: AC | PRN
Start: 2023-07-25 — End: ?

## 2023-07-25 NOTE — Patient Instructions (Addendum)
Medication Instructions:  Continue your current medications.  *If you need a refill on your cardiac medications before your next appointment, please call your pharmacy*  Testing/Procedures: Your EKG today looked good!  Follow-Up: At [facility], you and your health needs are our priority.  As part of our continuing mission to provide you with exceptional heart care, we have created designated Provider Care Teams.  These Care Teams include your primary Cardiologist (physician) and Advanced Practice Providers (APPs -  Physician Assistants and Nurse Practitioners) who all work together to provide you with the care you need, when you need it.  We recommend signing up for the patient portal called "MyChart".  Sign up information is provided on this After Visit Summary.  MyChart is used to connect with patients for Virtual Visits (Telemedicine).  Patients are able to view lab/test results, encounter notes, upcoming appointments, etc.  Non-urgent messages can be sent to your provider as well.   To learn more about what you can do with MyChart, go to [email or web address].    Your next appointment:   1 year  Provider:   Peter Swaziland, MD     Other Instructions  Heart Healthy Diet Recommendations: A low-salt diet is recommended. Meats should be grilled, baked, or boiled. Avoid fried foods. Focus on lean protein sources like fish or chicken with vegetables and fruits. The American Heart Association is a Chief Technology Officer!  American Heart Association Diet and Lifeystyle Recommendations   Exercise recommendations: The American Heart Association recommends 150 minutes of moderate intensity exercise weekly. Try 30 minutes of moderate intensity exercise 4-5 times per week. This could include walking, jogging, or swimming.

## 2023-07-29 ENCOUNTER — Ambulatory Visit: Payer: PRIVATE HEALTH INSURANCE | Admitting: Physician Assistant

## 2023-11-18 ENCOUNTER — Ambulatory Visit: Payer: Medicare Other | Admitting: Gastroenterology

## 2023-11-18 ENCOUNTER — Encounter: Payer: Self-pay | Admitting: Gastroenterology

## 2023-11-18 VITALS — BP 120/70 | HR 59 | Ht 74.0 in | Wt 239.6 lb

## 2023-11-18 DIAGNOSIS — R195 Other fecal abnormalities: Secondary | ICD-10-CM | POA: Diagnosis not present

## 2023-11-18 DIAGNOSIS — K649 Unspecified hemorrhoids: Secondary | ICD-10-CM | POA: Diagnosis not present

## 2023-11-18 MED ORDER — NA SULFATE-K SULFATE-MG SULF 17.5-3.13-1.6 GM/177ML PO SOLN: 1.0000 | ORAL | 0 refills | Status: AC

## 2023-11-18 NOTE — Progress Notes (Signed)
 Chief Complaint: Positive Hemoccult Primary GI MD: Dr. Abran  HPI: 68 year old male history of CAD (2008), hypertension, OSA, GERD, DM type II, CKD, hyperlipidemia, presents for evaluation of positive Hemoccult.  Reviewed referral from PCP.  Patient was referred here by PCP for positive Hemoccult.  CBC and CMP normal.  No evidence of anemia.  Hemoglobin 15.9  Follows with Dr. Peter Jordan cardiology and recently seen September 2024 to get preop clearance for hip replacement and was deemed acceptable risk.  Patient states overall he denies symptoms.  Very rarely he will have pellet-like stools but typically he has a normal soft formed BM without straining.  He also reports longstanding history of hemorrhoids with occasional intermittent blood noted on the tissue paper but states this is not a problem at this time.  Although, he is interested in banding in the future if they flareup again as he typically will use suppositories.  Denies family history of colon cancer.  States he typically has a 20 pound weight fluctuance which is normal for him.  States sometimes he will lose 20 pounds and then get it right back and this is based on his eating habits.  Last colonoscopy in 2017 with recall of 2027  PREVIOUS GI WORKUP   Colonoscopy 10/2016 - One 3 mm polyp in the cecum, removed with a cold snare. Resected and retrieved.  (Polypoid mucosa) - Diverticulosis in the sigmoid colon.  - Internal hemorrhoids.  - The examination was otherwise normal on direct and retroflexion views. -Repeat 12/12.7  Past Medical History:  Diagnosis Date   Allergy    Arthritis    joints- in general    Coronary artery disease    Diabetes mellitus without complication (HCC)    Hgb A1c- has lately reduced to 5.6, per management by Dr. Shepard, has lost 30 +lbs.   Encounter for blood transfusion    as a newborn, on two occasions had bld. transfusion   GERD (gastroesophageal reflux disease)    Gout    Hepatitis     Hyperlipidemia    Hypertension    Myocardial infarction [facility]) 2008   Neuromuscular disorder (HCC)    funny sensations in both feet on & off, nerves chronically traumatized with problemed feet     Obesity    Sleep apnea    uses CPAP- last study- long ago    Past Surgical History:  Procedure Laterality Date   CORONARY ANGIOPLASTY WITH STENT PLACEMENT     dental implant     ELBOW SURGERY Right 1984   bone spur removed    INSERTION OF MESH N/A 08/31/2014   Procedure: INSERTION OF MESH;  Surgeon: Donnice Lima, MD;  Location: [facility] OR;  Service: General;  Laterality: N/A;   JOINT REPLACEMENT Left 2010   hip   SIGMOIDOSCOPY     30 yrs ago    UMBILICAL HERNIA REPAIR  08/31/2014   WITH [facility]         DR TSUEI   UMBILICAL HERNIA REPAIR N/A 08/31/2014   Procedure: HERNIA REPAIR UMBILICAL ADULT;  Surgeon: Donnice Lima, MD;  Location: [facility] OR;  Service: General;  Laterality: N/A;   VASECTOMY      Current Outpatient Medications  Medication Sig Dispense Refill   aspirin  EC 81 MG tablet Take 81 mg by mouth daily.     atorvastatin  (LIPITOR ) 80 MG tablet TAKE 1 TABLET BY MOUTH EVERY DAY 90 tablet 3   cetirizine (ZYRTEC) 10 MG tablet Take 10 mg by mouth at bedtime.  Cholecalciferol (VITAMIN D) 50 MCG (2000 UT) tablet Take 2000 IU twice a day     empagliflozin (JARDIANCE) 25 MG TABS tablet Take 25 mg by mouth daily before breakfast. 30 tablet    famotidine  (PEPCID ) 20 MG tablet Take 20 mg by mouth 2 (two) times daily.     icosapent Ethyl (VASCEPA) 1 g capsule Take 2 capsules twice a day 120 capsule    indomethacin (INDOCIN SR) 75 MG CR capsule Take 75 mg by mouth 3 (three) times daily as needed for mild pain.      metFORMIN  (GLUCOPHAGE ) 500 MG tablet Take 500 mg by mouth 2 (two) times daily with a meal.      methocarbamol  (ROBAXIN ) 500 MG tablet Take 1 tablet (500 mg total) by mouth 4 (four) times daily. 30 tablet 0   metoprolol  succinate (TOPROL -XL) 50 MG 24 hr tablet TAKE 1 TABLET(50 MG) BY  MOUTH DAILY WITH OR IMMEDIATELY FOLLOWING A MEAL 90 tablet 1   Multiple Vitamin (MULTIVITAMIN) tablet Take 1 tablet by mouth daily.     nitroGLYCERIN  (NITROSTAT ) 0.4 MG SL tablet Place 1 tablet (0.4 mg total) under the tongue every 5 (five) minutes as needed for chest pain. 25 tablet 1   Testosterone  1.62 % GEL SMARTSIG:40.5 Milligram(s) Topical Every Morning     traMADol  (ULTRAM ) 50 MG tablet Take 1 tablet (50 mg total) by mouth every 6 (six) hours as needed. 20 tablet 0   Current Facility-Administered Medications  Medication Dose Route Frequency Provider Last Rate Last Admin   0.9 %  sodium chloride  infusion  500 mL Intravenous Continuous Abran Norleen SAILOR, MD        Allergies as of 11/18/2023 - Review Complete 11/18/2023  Allergen Reaction Noted   Bee venom Swelling 08/22/2014    Family History  Problem Relation Age of Onset   Coronary artery disease Mother    Breast cancer Mother    Other Father    Coronary artery disease Father    Heart failure Father    Colon cancer Neg Hx    Colon polyps Neg Hx     Social History   Socioeconomic History   Marital status: Married    Spouse name: Not on file   Number of children: Not on file   Years of education: Not on file   Highest education level: Not on file  Occupational History   Not on file  Tobacco Use   Smoking status: Never   Smokeless tobacco: Never  Substance and Sexual Activity   Alcohol  use: Yes    Comment: occas,- less than social    Drug use: No   Sexual activity: Yes    Birth control/protection: Surgical  Other Topics Concern   Not on file  Social History Narrative   Not on file   Social Drivers of Health   Financial Resource Strain: Not on file  Food Insecurity: Not on file  Transportation Needs: Not on file  Physical Activity: Not on file  Stress: Not on file  Social Connections: Not on file  Intimate Partner Violence: Not on file    Review of Systems:    Constitutional: No weight loss, fever,  chills, weakness or fatigue HEENT: Eyes: No change in vision               Ears, Nose, Throat:  No change in hearing or congestion Skin: No rash or itching Cardiovascular: No chest pain, chest pressure or palpitations   Respiratory: No SOB or cough Gastrointestinal:  See HPI and otherwise negative Genitourinary: No dysuria or change in urinary frequency Neurological: No headache, dizziness or syncope Musculoskeletal: No new muscle or joint pain Hematologic: No bleeding or bruising Psychiatric: No history of depression or anxiety    Physical Exam:  Vital signs: BP 120/70   Pulse (!) 59   Ht 6' 2 (1.88 m)   Wt 239 lb 9.6 oz (108.7 kg)   BMI 30.76 kg/m   Constitutional: NAD, Well developed, Well nourished, alert and cooperative Head:  Normocephalic and atraumatic. Eyes:   PEERL, EOMI. No icterus. Conjunctiva pink. Respiratory: Respirations even and unlabored. Lungs clear to auscultation bilaterally.   No wheezes, crackles, or rhonchi.  Cardiovascular:  Regular rate and rhythm. No peripheral edema, cyanosis or pallor.  Gastrointestinal:  Soft, nondistended, nontender. No rebound or guarding. Normal bowel sounds. No appreciable masses or hepatomegaly. Rectal:  Not performed.  Msk:  Symmetrical without gross deformities. Without edema, no deformity or joint abnormality.  Neurologic:  Alert and  oriented x4;  grossly normal neurologically.  Skin:   Dry and intact without significant lesions or rashes. Psychiatric: Oriented to person, place and time. Demonstrates good judgement and reason without abnormal affect or behaviors.   RELEVANT LABS AND IMAGING: CBC    Component Value Date/Time   WBC 9.7 04/13/2018 0431   RBC 4.36 04/13/2018 0431   HGB 13.1 04/13/2018 0431   HCT 39.4 04/13/2018 0431   PLT 212 04/13/2018 0431   MCV 90.4 04/13/2018 0431   MCH 30.0 04/13/2018 0431   MCHC 33.2 04/13/2018 0431   RDW 12.4 04/13/2018 0431   LYMPHSABS 2.1 04/12/2018 2317   MONOABS 0.8  04/12/2018 2317   EOSABS 0.2 04/12/2018 2317   BASOSABS 0.0 04/12/2018 2317    CMP     Component Value Date/Time   NA 140 09/23/2018 1217   K 5.0 09/23/2018 1217   CL 105 09/23/2018 1217   CO2 20 09/23/2018 1217   GLUCOSE 126 (H) 09/23/2018 1217   GLUCOSE 110 (H) 04/13/2018 0431   BUN 26 09/23/2018 1217   CREATININE 1.53 (H) 09/23/2018 1217   CALCIUM  9.8 09/23/2018 1217   PROT 7.2 09/23/2018 1217   ALBUMIN 4.8 09/23/2018 1217   AST 16 09/23/2018 1217   ALT 17 09/23/2018 1217   ALKPHOS 96 09/23/2018 1217   BILITOT 0.3 09/23/2018 1217   GFRNONAA 48 (L) 09/23/2018 1217   GFRAA 56 (L) 09/23/2018 1217     Assessment/Plan:   Positive Hemoccult Positive Hemoccult with PCP.  No overt bleeding.  Though does report very rare intermittent scant blood on tissue paper when his hemorrhoids flareup but he states he has no issues with that at this time.  No family history of colon cancer.  No anemia.  Last colonoscopy in 2017 with benign polyp and repeat in 10 years.  Suspect hemorrhoid could have caused positive Hemoccult - Colonoscopy for further evaluation in the [facility] - I thoroughly discussed the procedure with the patient (at bedside) to include nature of the procedure, alternatives, benefits, and risks (including but not limited to bleeding, infection, perforation, anesthesia/cardiac pulmonary complications).  Patient verbalized understanding and gave verbal consent to proceed with procedure.  - No presence of overt bleeding or anemia.  If colonoscopy unrevealing, can consider EGD for further evaluation.  No symptoms.  Hemorrhoids Patient occasionally struggles with hemorrhoids.  Not an issue for him at this time.  If recurrence of hemorrhoids can set up for banding.  Evaluate during colonoscopy.  Nestor Blower, PA-C  [facility] 11/18/2023, 11:49 AM  Cc: Shepard Ade, MD

## 2023-11-18 NOTE — Progress Notes (Signed)
 Agree with comprehensive H&P/consultation note as outlined above.

## 2023-11-18 NOTE — Patient Instructions (Signed)
 We have sent the following medications to your pharmacy for you to pick up at your convenience: Suprep  You have been scheduled for a colonoscopy. Please follow written instructions given to you at your visit today.   Please pick up your prep supplies at the pharmacy within the next 1-3 days.  If you use inhalers (even only as needed), please bring them with you on the day of your procedure.  DO NOT TAKE 7 DAYS PRIOR TO TEST- Trulicity (dulaglutide) Ozempic, Wegovy (semaglutide) Mounjaro (tirzepatide) Bydureon Bcise (exanatide extended release)  DO NOT TAKE 1 DAY PRIOR TO YOUR TEST Rybelsus (semaglutide) Adlyxin (lixisenatide) Victoza (liraglutide) Byetta (exanatide) ___________________________________________________________________________  Due to recent changes in healthcare laws, you may see the results of your imaging and laboratory studies on MyChart before your provider has had a chance to review them.  We understand that in some cases there may be results that are confusing or concerning to you. Not all laboratory results come back in the same time frame and the provider may be waiting for multiple results in order to interpret others.  Please give us  48 hours in order for your provider to thoroughly review all the results before contacting the office for clarification of your results.   _______________________________________________________  If your blood pressure at your visit was 140/90 or greater, please contact your primary care physician to follow up on this.  _______________________________________________________  If you are age 68 or older, your body mass index should be between 23-30. Your Body mass index is 30.76 kg/m. If this is out of the aforementioned range listed, please consider follow up with your Primary Care Provider.  If you are age 48 or younger, your body mass index should be between 19-25. Your Body mass index is 30.76 kg/m. If this is out of the  aformentioned range listed, please consider follow up with your Primary Care Provider.   ________________________________________________________  The [facility] providers would like to encourage you to use MYCHART to communicate with providers for non-urgent requests or questions.  Due to long hold times on the telephone, sending your provider a message by [facility] may be a faster and more efficient way to get a response.  Please allow 48 business hours for a response.  Please remember that this is for non-urgent requests.  _______________________________________________________  Thank you for choosing me and [facility].  Nestor Blower, PA-C

## 2023-11-22 ENCOUNTER — Other Ambulatory Visit: Payer: Self-pay | Admitting: Nurse Practitioner

## 2023-12-16 ENCOUNTER — Telehealth: Payer: Self-pay | Admitting: Gastroenterology

## 2023-12-16 NOTE — Telephone Encounter (Signed)
Patient called and stated that he has eaten seeds and popcorn. Patient is wanting to know if he needs to reschedule his colonoscopy procedure.Patient has a scheduled procedure for Feb 6. Patient is requesting a call back. Please advise.

## 2023-12-16 NOTE — Telephone Encounter (Signed)
Patient is advised that he may keep his scheduled procedure on 12/18/23 but should have no further restricted foods (ie seeds, popcorn, nuts) from the point forward. Patient verbalizes understanding.

## 2023-12-18 ENCOUNTER — Ambulatory Visit: Payer: Medicare Other | Admitting: Internal Medicine

## 2023-12-18 ENCOUNTER — Encounter: Payer: Self-pay | Admitting: Internal Medicine

## 2023-12-18 VITALS — BP 109/58 | HR 60 | Temp 98.2°F | Resp 15 | Ht 74.0 in | Wt 239.0 lb

## 2023-12-18 DIAGNOSIS — Z1211 Encounter for screening for malignant neoplasm of colon: Secondary | ICD-10-CM | POA: Diagnosis not present

## 2023-12-18 DIAGNOSIS — K5730 Diverticulosis of large intestine without perforation or abscess without bleeding: Secondary | ICD-10-CM | POA: Diagnosis not present

## 2023-12-18 DIAGNOSIS — R195 Other fecal abnormalities: Secondary | ICD-10-CM

## 2023-12-18 DIAGNOSIS — K648 Other hemorrhoids: Secondary | ICD-10-CM

## 2023-12-18 MED ORDER — SODIUM CHLORIDE 0.9 % IV SOLN
500.0000 mL | Freq: Once | INTRAVENOUS | Status: AC
Start: 2023-12-18 — End: ?

## 2023-12-18 NOTE — Op Note (Signed)
 [facility] Patient Name: Peter Gray Procedure Date: 12/18/2023 2:24 PM MRN: [id number] Endoscopist: Norleen SAILOR. Abran , MD, [id number] Age: 68 Referring MD:  Date of Birth: 1956-04-09 Gender: Male Account #: [id number] Procedure:                Colonoscopy Indications:              Heme positive stool. Previous examination 2017 was                            negative for neoplasia Medicines:                Monitored Anesthesia Care Procedure:                Pre-Anesthesia Assessment:                           - Prior to the procedure, a History and Physical                            was performed, and patient medications and                            allergies were reviewed. The patient's tolerance of                            previous anesthesia was also reviewed. The risks                            and benefits of the procedure and the sedation                            options and risks were discussed with the patient.                            All questions were answered, and informed consent                            was obtained. Prior Anticoagulants: The patient has                            taken no anticoagulant or antiplatelet agents.                            After reviewing the risks and benefits, the patient                            was deemed in satisfactory condition to undergo the                            procedure.                           After obtaining informed consent, the colonoscope  was passed under direct vision. Throughout the                            procedure, the patient's blood pressure, pulse, and                            oxygen saturations were monitored continuously. The                            Olympus Scope SN: [id number] was introduced through                            the anus and advanced to the the cecum, identified                            by appendiceal orifice and ileocecal valve. The                             ileocecal valve, appendiceal orifice, and rectum                            were photographed. The quality of the bowel                            preparation was excellent. The colonoscopy was                            performed without difficulty. The patient tolerated                            the procedure well. The bowel preparation used was                            SUPREP via split dose instruction. Scope In: 2:30:58 PM Scope Out: 2:46:22 PM Scope Withdrawal Time: 0 hours 10 minutes 27 seconds  Total Procedure Duration: 0 hours 15 minutes 24 seconds  Findings:                 Multiple diverticula were found in the left colon.                            Small internal hemorrhoids.                           The exam was otherwise without abnormality on                            direct and retroflexion views. Complications:            No immediate complications. Estimated blood loss:                            None. Estimated Blood Loss:     Estimated blood loss: none. Impression:               - Diverticulosis  in the left colon.                           - The examination was otherwise normal on direct                            and retroflexion views.                           - No specimens collected. Recommendation:           - Repeat colonoscopy in 10 years for screening                            purposes.                           - Patient has a contact number available for                            emergencies. The signs and symptoms of potential                            delayed complications were discussed with the                            patient. Return to normal activities tomorrow.                            Written discharge instructions were provided to the                            patient.                           - Resume previous diet.                           - Continue present medications. Norleen SAILOR. Abran, MD 12/18/2023 2:52:34  PM This report has been signed electronically.

## 2023-12-18 NOTE — Progress Notes (Signed)
 Vss nad trans to pacu

## 2023-12-18 NOTE — Progress Notes (Signed)
 Pt's states no medical or surgical changes since previsit or office visit.

## 2023-12-18 NOTE — Progress Notes (Signed)
 Expand All Collapse All    Chief Complaint: Positive Hemoccult Primary GI MD: Dr. Abran   HPI: 68 year old male history of CAD (2008), hypertension, OSA, GERD, DM type II, CKD, hyperlipidemia, presents for evaluation of positive Hemoccult.   Reviewed referral from PCP.  Patient was referred here by PCP for positive Hemoccult.  CBC and CMP normal.  No evidence of anemia.  Hemoglobin 15.9   Follows with Dr. Peter Jordan cardiology and recently seen September 2024 to get preop clearance for hip replacement and was deemed acceptable risk.   Patient states overall he denies symptoms.  Very rarely he will have pellet-like stools but typically he has a normal soft formed BM without straining.  He also reports longstanding history of hemorrhoids with occasional intermittent blood noted on the tissue paper but states this is not a problem at this time.  Although, he is interested in banding in the future if they flareup again as he typically will use suppositories.   Denies family history of colon cancer.  States he typically has a 20 pound weight fluctuance which is normal for him.  States sometimes he will lose 20 pounds and then get it right back and this is based on his eating habits.   Last colonoscopy in 2017 with recall of 2027   PREVIOUS GI WORKUP    Colonoscopy 10/2016 - One 3 mm polyp in the cecum, removed with a cold snare. Resected and retrieved.  (Polypoid mucosa) - Diverticulosis in the sigmoid colon.  - Internal hemorrhoids.  - The examination was otherwise normal on direct and retroflexion views. -Repeat 12/12.7       Past Medical History:  Diagnosis Date   Allergy     Arthritis      joints- in general    Coronary artery disease     Diabetes mellitus without complication (HCC)      Hgb A1c- has lately reduced to 5.6, per management by Dr. Shepard, has lost 30 +lbs.   Encounter for blood transfusion      as a newborn, on two occasions had bld. transfusion   GERD  (gastroesophageal reflux disease)     Gout     Hepatitis     Hyperlipidemia     Hypertension     Myocardial infarction [facility]) 2008   Neuromuscular disorder (HCC)      funny sensations in both feet on & off, nerves chronically traumatized with problemed feet     Obesity     Sleep apnea      uses CPAP- last study- long ago               Past Surgical History:  Procedure Laterality Date   CORONARY ANGIOPLASTY WITH STENT PLACEMENT       dental implant       ELBOW SURGERY Right 1984    bone spur removed    INSERTION OF MESH N/A 08/31/2014    Procedure: INSERTION OF MESH;  Surgeon: Donnice Lima, MD;  Location: [facility] OR;  Service: General;  Laterality: N/A;   JOINT REPLACEMENT Left 2010    hip   SIGMOIDOSCOPY        30 yrs ago    UMBILICAL HERNIA REPAIR   08/31/2014    WITH [facility]         DR TSUEI   UMBILICAL HERNIA REPAIR N/A 08/31/2014    Procedure: HERNIA REPAIR UMBILICAL ADULT;  Surgeon: Donnice Lima, MD;  Location: [facility] OR;  Service: General;  Laterality: N/A;  VASECTOMY                    Current Outpatient Medications  Medication Sig Dispense Refill   aspirin  EC 81 MG tablet Take 81 mg by mouth daily.       atorvastatin  (LIPITOR ) 80 MG tablet TAKE 1 TABLET BY MOUTH EVERY DAY 90 tablet 3   cetirizine (ZYRTEC) 10 MG tablet Take 10 mg by mouth at bedtime.        Cholecalciferol (VITAMIN D) 50 MCG (2000 UT) tablet Take 2000 IU twice a day       empagliflozin (JARDIANCE) 25 MG TABS tablet Take 25 mg by mouth daily before breakfast. 30 tablet     famotidine  (PEPCID ) 20 MG tablet Take 20 mg by mouth 2 (two) times daily.       icosapent Ethyl (VASCEPA) 1 g capsule Take 2 capsules twice a day 120 capsule     indomethacin (INDOCIN SR) 75 MG CR capsule Take 75 mg by mouth 3 (three) times daily as needed for mild pain.        metFORMIN  (GLUCOPHAGE ) 500 MG tablet Take 500 mg by mouth 2 (two) times daily with a meal.        methocarbamol  (ROBAXIN ) 500 MG tablet Take 1 tablet (500 mg  total) by mouth 4 (four) times daily. 30 tablet 0   metoprolol  succinate (TOPROL -XL) 50 MG 24 hr tablet TAKE 1 TABLET(50 MG) BY MOUTH DAILY WITH OR IMMEDIATELY FOLLOWING A MEAL 90 tablet 1   Multiple Vitamin (MULTIVITAMIN) tablet Take 1 tablet by mouth daily.       nitroGLYCERIN  (NITROSTAT ) 0.4 MG SL tablet Place 1 tablet (0.4 mg total) under the tongue every 5 (five) minutes as needed for chest pain. 25 tablet 1   Testosterone  1.62 % GEL SMARTSIG:40.5 Milligram(s) Topical Every Morning       traMADol  (ULTRAM ) 50 MG tablet Take 1 tablet (50 mg total) by mouth every 6 (six) hours as needed. 20 tablet 0               Current Facility-Administered Medications  Medication Dose Route Frequency Provider Last Rate Last Admin   0.9 %  sodium chloride  infusion  500 mL Intravenous Continuous Abran Norleen SAILOR, MD                 Allergies as of 11/18/2023 - Review Complete 11/18/2023  Allergen Reaction Noted   Bee venom Swelling 08/22/2014           Family History  Problem Relation Age of Onset   Coronary artery disease Mother     Breast cancer Mother     Other Father     Coronary artery disease Father     Heart failure Father     Colon cancer Neg Hx     Colon polyps Neg Hx            Social History         Socioeconomic History   Marital status: Married      Spouse name: Not on file   Number of children: Not on file   Years of education: Not on file   Highest education level: Not on file  Occupational History   Not on file  Tobacco Use   Smoking status: Never   Smokeless tobacco: Never  Substance and Sexual Activity   Alcohol  use: Yes      Comment: occas,- less than social    Drug use: No  Sexual activity: Yes      Birth control/protection: Surgical  Other Topics Concern   Not on file  Social History Narrative   Not on file    Social Drivers of Health    Financial Resource Strain: Not on file  Food Insecurity: Not on file  Transportation Needs: Not on file   Physical Activity: Not on file  Stress: Not on file  Social Connections: Not on file  Intimate Partner Violence: Not on file      Review of Systems:    Constitutional: No weight loss, fever, chills, weakness or fatigue HEENT: Eyes: No change in vision               Ears, Nose, Throat:  No change in hearing or congestion Skin: No rash or itching Cardiovascular: No chest pain, chest pressure or palpitations   Respiratory: No SOB or cough Gastrointestinal: See HPI and otherwise negative Genitourinary: No dysuria or change in urinary frequency Neurological: No headache, dizziness or syncope Musculoskeletal: No new muscle or joint pain Hematologic: No bleeding or bruising Psychiatric: No history of depression or anxiety      Physical Exam:  Vital signs: BP 120/70   Pulse (!) 59   Ht 6' 2 (1.88 m)   Wt 239 lb 9.6 oz (108.7 kg)   BMI 30.76 kg/m    Constitutional: NAD, Well developed, Well nourished, alert and cooperative Head:  Normocephalic and atraumatic. Eyes:   PEERL, EOMI. No icterus. Conjunctiva pink. Respiratory: Respirations even and unlabored. Lungs clear to auscultation bilaterally.   No wheezes, crackles, or rhonchi.  Cardiovascular:  Regular rate and rhythm. No peripheral edema, cyanosis or pallor.  Gastrointestinal:  Soft, nondistended, nontender. No rebound or guarding. Normal bowel sounds. No appreciable masses or hepatomegaly. Rectal:  Not performed.  Msk:  Symmetrical without gross deformities. Without edema, no deformity or joint abnormality.  Neurologic:  Alert and  oriented x4;  grossly normal neurologically.  Skin:   Dry and intact without significant lesions or rashes. Psychiatric: Oriented to person, place and time. Demonstrates good judgement and reason without abnormal affect or behaviors.     RELEVANT LABS AND IMAGING: CBC Labs (Brief)          Component Value Date/Time    WBC 9.7 04/13/2018 0431    RBC 4.36 04/13/2018 0431    HGB 13.1  04/13/2018 0431    HCT 39.4 04/13/2018 0431    PLT 212 04/13/2018 0431    MCV 90.4 04/13/2018 0431    MCH 30.0 04/13/2018 0431    MCHC 33.2 04/13/2018 0431    RDW 12.4 04/13/2018 0431    LYMPHSABS 2.1 04/12/2018 2317    MONOABS 0.8 04/12/2018 2317    EOSABS 0.2 04/12/2018 2317    BASOSABS 0.0 04/12/2018 2317        CMP     Labs (Brief)          Component Value Date/Time    NA 140 09/23/2018 1217    K 5.0 09/23/2018 1217    CL 105 09/23/2018 1217    CO2 20 09/23/2018 1217    GLUCOSE 126 (H) 09/23/2018 1217    GLUCOSE 110 (H) 04/13/2018 0431    BUN 26 09/23/2018 1217    CREATININE 1.53 (H) 09/23/2018 1217    CALCIUM  9.8 09/23/2018 1217    PROT 7.2 09/23/2018 1217    ALBUMIN 4.8 09/23/2018 1217    AST 16 09/23/2018 1217    ALT 17 09/23/2018 1217  ALKPHOS 96 09/23/2018 1217    BILITOT 0.3 09/23/2018 1217    GFRNONAA 48 (L) 09/23/2018 1217    GFRAA 56 (L) 09/23/2018 1217          Assessment/Plan:    Positive Hemoccult Positive Hemoccult with PCP.  No overt bleeding.  Though does report very rare intermittent scant blood on tissue paper when his hemorrhoids flareup but he states he has no issues with that at this time.  No family history of colon cancer.  No anemia.  Last colonoscopy in 2017 with benign polyp and repeat in 10 years.  Suspect hemorrhoid could have caused positive Hemoccult - Colonoscopy for further evaluation in the [facility] - I thoroughly discussed the procedure with the patient (at bedside) to include nature of the procedure, alternatives, benefits, and risks (including but not limited to bleeding, infection, perforation, anesthesia/cardiac pulmonary complications).  Patient verbalized understanding and gave verbal consent to proceed with procedure.  - No presence of overt bleeding or anemia.  If colonoscopy unrevealing, can consider EGD for further evaluation.  No symptoms.   Hemorrhoids Patient occasionally struggles with hemorrhoids.  Not an issue for him  at this time.  If recurrence of hemorrhoids can set up for banding.  Evaluate during colonoscopy.   Nestor Mollie RIGGERS [facility] 11/18/2023, 11:49 AM   Cc: Shepard Ade, MD

## 2023-12-18 NOTE — Patient Instructions (Signed)

## 2023-12-19 ENCOUNTER — Telehealth: Payer: Self-pay

## 2023-12-19 NOTE — Telephone Encounter (Signed)
 Post procedure follow up call, no answering

## 2024-05-09 ENCOUNTER — Other Ambulatory Visit: Payer: Self-pay | Admitting: Cardiology

## 2024-08-18 ENCOUNTER — Other Ambulatory Visit: Payer: Self-pay | Admitting: Cardiology

## 2024-09-17 ENCOUNTER — Other Ambulatory Visit: Payer: Self-pay | Admitting: Cardiology

## 2024-10-27 ENCOUNTER — Other Ambulatory Visit: Payer: Self-pay | Admitting: Cardiology

## 2024-11-22 ENCOUNTER — Other Ambulatory Visit: Payer: Self-pay | Admitting: Cardiology
